# Patient Record
Sex: Female | Born: 1955 | Race: White | Hispanic: No | Marital: Married | Smoking: Never smoker
Health system: Southern US, Community
[De-identification: ages and names within clinical notes are randomized; demographics above are authoritative.]

## PROBLEM LIST (undated history)

## (undated) DIAGNOSIS — R519 Headache, unspecified: Secondary | ICD-10-CM

## (undated) DIAGNOSIS — R51 Headache: Secondary | ICD-10-CM

## (undated) DIAGNOSIS — F32A Depression, unspecified: Secondary | ICD-10-CM

## (undated) DIAGNOSIS — Z800 Family history of malignant neoplasm of digestive organs: Secondary | ICD-10-CM

## (undated) DIAGNOSIS — F329 Major depressive disorder, single episode, unspecified: Secondary | ICD-10-CM

## (undated) DIAGNOSIS — D649 Anemia, unspecified: Secondary | ICD-10-CM

## (undated) DIAGNOSIS — K219 Gastro-esophageal reflux disease without esophagitis: Secondary | ICD-10-CM

## (undated) DIAGNOSIS — F419 Anxiety disorder, unspecified: Secondary | ICD-10-CM

## (undated) DIAGNOSIS — I10 Essential (primary) hypertension: Secondary | ICD-10-CM

## (undated) DIAGNOSIS — Z8719 Personal history of other diseases of the digestive system: Secondary | ICD-10-CM

## (undated) DIAGNOSIS — M3500 Sicca syndrome, unspecified: Secondary | ICD-10-CM

## (undated) HISTORY — PX: OTHER SURGICAL HISTORY: SHX169

## (undated) HISTORY — DX: Sjogren syndrome, unspecified: M35.00

## (undated) HISTORY — PX: ABDOMINAL HYSTERECTOMY: SHX81

## (undated) HISTORY — DX: Family history of malignant neoplasm of digestive organs: Z80.0

---

## 1990-01-08 HISTORY — PX: BUNIONECTOMY: SHX129

## 1998-01-17 ENCOUNTER — Other Ambulatory Visit: Admission: RE | Admit: 1998-01-17 | Discharge: 1998-01-17 | Payer: Self-pay | Admitting: Obstetrics and Gynecology

## 2000-06-13 ENCOUNTER — Encounter: Payer: Self-pay | Admitting: Family Medicine

## 2000-06-13 ENCOUNTER — Ambulatory Visit (HOSPITAL_COMMUNITY): Admission: RE | Admit: 2000-06-13 | Discharge: 2000-06-13 | Payer: Self-pay | Admitting: Family Medicine

## 2000-12-23 ENCOUNTER — Ambulatory Visit (HOSPITAL_COMMUNITY): Admission: RE | Admit: 2000-12-23 | Discharge: 2000-12-23 | Payer: Self-pay | Admitting: Gastroenterology

## 2001-02-19 ENCOUNTER — Other Ambulatory Visit: Admission: RE | Admit: 2001-02-19 | Discharge: 2001-02-19 | Payer: Self-pay | Admitting: Gynecology

## 2002-03-16 ENCOUNTER — Other Ambulatory Visit: Admission: RE | Admit: 2002-03-16 | Discharge: 2002-03-16 | Payer: Self-pay | Admitting: Gynecology

## 2003-04-12 ENCOUNTER — Other Ambulatory Visit: Admission: RE | Admit: 2003-04-12 | Discharge: 2003-04-12 | Payer: Self-pay | Admitting: Gynecology

## 2004-04-24 ENCOUNTER — Other Ambulatory Visit: Admission: RE | Admit: 2004-04-24 | Discharge: 2004-04-24 | Payer: Self-pay | Admitting: Gynecology

## 2005-06-11 ENCOUNTER — Other Ambulatory Visit: Admission: RE | Admit: 2005-06-11 | Discharge: 2005-06-11 | Payer: Self-pay | Admitting: Gynecology

## 2006-06-24 ENCOUNTER — Other Ambulatory Visit: Admission: RE | Admit: 2006-06-24 | Discharge: 2006-06-24 | Payer: Self-pay | Admitting: Gynecology

## 2007-09-25 ENCOUNTER — Encounter: Admission: RE | Admit: 2007-09-25 | Discharge: 2007-09-25 | Payer: Self-pay | Admitting: Gynecology

## 2008-12-20 ENCOUNTER — Encounter: Admission: RE | Admit: 2008-12-20 | Discharge: 2008-12-20 | Payer: Self-pay | Admitting: Gynecology

## 2009-12-21 ENCOUNTER — Encounter
Admission: RE | Admit: 2009-12-21 | Discharge: 2009-12-21 | Payer: Self-pay | Source: Home / Self Care | Attending: Gynecology | Admitting: Gynecology

## 2010-05-26 NOTE — Procedures (Signed)
[place]. [facility]  Patient:    MIYO, AINA Visit Number: [id number] MRN: [id number]          Service Type: END Location: ENDO Attending Physician:  Rich Brave Dictated by:   Florencia Reasons, M.D. Proc. Date: 12/23/00 Admit Date:  12/23/2000   CC:         Esmeralda Arthur, M.D.   Procedure Report  PROCEDURE PERFORMED:  Colonoscopy.  ENDOSCOPIST:  Florencia Reasons, M.D.  INDICATIONS FOR PROCEDURE:  The patient is a 54 year old with a family history of colon cancer in her half sister at age 16, plus a history of polyps in her brother and another half sister.  The patient herself had a negative colonoscopy by me six years ago.  FINDINGS:  Normal exam to the terminal ileum.  DESCRIPTION OF PROCEDURE:  The nature, purpose and risks of the procedure were familiar to the patient from prior examination and she provided written consent.  Sedation was fentanyl 50 mcg and Versed 5 mg IV without arrhythmias or desaturation.  The Olympus adjustable tension pediatric video colonoscope was easily advanced quite easily to the cecum and for a short distance into a normal-appearing terminal ileum, whereupon pullback was initiated.  The quality of the prep was excellent and it is felt that all areas were well seen.  This was a normal examination.  No polyps, cancer, colitis, vascular malformations or diverticulosis were noted.  The patient tolerated the procedure well and there were no apparent complications.  No biopsies were obtained.  IMPRESSION:  Normal screening colonoscopy in a patient with a family history of colon cancer.  PLAN: Consider follow-up colonoscopy in five years for ongoing screening. Dictated by:   Florencia Reasons, M.D. Attending Physician:  Rich Brave DD:  12/23/00 TD:  12/23/00 Job: [phone]

## 2011-11-26 ENCOUNTER — Other Ambulatory Visit: Payer: Self-pay | Admitting: Gynecology

## 2011-11-26 DIAGNOSIS — Z1231 Encounter for screening mammogram for malignant neoplasm of breast: Secondary | ICD-10-CM

## 2011-12-27 ENCOUNTER — Ambulatory Visit: Payer: Self-pay

## 2012-09-02 ENCOUNTER — Encounter (INDEPENDENT_AMBULATORY_CARE_PROVIDER_SITE_OTHER): Payer: Self-pay | Admitting: *Deleted

## 2012-09-03 ENCOUNTER — Encounter (INDEPENDENT_AMBULATORY_CARE_PROVIDER_SITE_OTHER): Payer: Self-pay

## 2013-11-18 ENCOUNTER — Other Ambulatory Visit (INDEPENDENT_AMBULATORY_CARE_PROVIDER_SITE_OTHER): Payer: Self-pay | Admitting: *Deleted

## 2013-11-18 ENCOUNTER — Encounter (INDEPENDENT_AMBULATORY_CARE_PROVIDER_SITE_OTHER): Payer: Self-pay | Admitting: *Deleted

## 2013-11-18 DIAGNOSIS — Z800 Family history of malignant neoplasm of digestive organs: Secondary | ICD-10-CM

## 2013-11-18 NOTE — Telephone Encounter (Signed)
This encounter was created in error - please disregard.

## 2014-01-12 ENCOUNTER — Telehealth (INDEPENDENT_AMBULATORY_CARE_PROVIDER_SITE_OTHER): Payer: Self-pay | Admitting: *Deleted

## 2014-01-12 DIAGNOSIS — Z1211 Encounter for screening for malignant neoplasm of colon: Secondary | ICD-10-CM

## 2014-01-12 NOTE — Telephone Encounter (Signed)
Patient needs movi prep 

## 2014-01-15 MED ORDER — PEG-KCL-NACL-NASULF-NA ASC-C 100 G PO SOLR: 1.0000 | Freq: Once | ORAL | Status: DC

## 2014-01-19 ENCOUNTER — Telehealth (INDEPENDENT_AMBULATORY_CARE_PROVIDER_SITE_OTHER): Payer: Self-pay | Admitting: *Deleted

## 2014-01-19 NOTE — Telephone Encounter (Signed)
Referring MD/PCP: daniel   Procedure: tcs  Reason/Indication:  fam hx colon ca  Has patient had this procedure before?  Yes, 2009 -- scanned  If so, when, by whom and where?    Is there a family history of colon cancer?  Yes, sister  Who?  What age when diagnosed?    Is patient diabetic?   no      Does patient have prosthetic heart valve?  no  Do you have a pacemaker?  no  Has patient ever had endocarditis? no  Has patient had joint replacement within last 12 months?  no  Does patient tend to be constipated or take laxatives? no  Is patient on Coumadin, Plavix and/or Aspirin? no  Medications: welbutrin 150 mg daily, evoxac 30 mg up to 4 tabs daily, zoloft 100 mg bid, cozaar 100 mg daily, dexilant 60 mg daily, restasis eye drops bid, elestrin 2 pumps daily  Allergies: sulfur, ace inhibitors  Medication Adjustment:   Procedure date & time: 02/17/14 at 830

## 2014-01-22 NOTE — Telephone Encounter (Signed)
agree

## 2014-02-02 ENCOUNTER — Encounter (INDEPENDENT_AMBULATORY_CARE_PROVIDER_SITE_OTHER): Payer: Self-pay | Admitting: *Deleted

## 2014-02-17 ENCOUNTER — Encounter (HOSPITAL_COMMUNITY): Admission: RE | Payer: Self-pay | Source: Ambulatory Visit

## 2014-02-17 ENCOUNTER — Ambulatory Visit (HOSPITAL_COMMUNITY)
Admission: RE | Admit: 2014-02-17 | Payer: PRIVATE HEALTH INSURANCE | Source: Ambulatory Visit | Admitting: Internal Medicine

## 2014-02-17 SURGERY — COLONOSCOPY
Anesthesia: Moderate Sedation

## 2015-09-23 ENCOUNTER — Encounter (INDEPENDENT_AMBULATORY_CARE_PROVIDER_SITE_OTHER): Payer: Self-pay | Admitting: *Deleted

## 2016-08-10 ENCOUNTER — Emergency Department (HOSPITAL_COMMUNITY)
Admission: EM | Admit: 2016-08-10 | Discharge: 2016-08-10 | Disposition: A | Discharge status: Home / Self Care | Payer: 59 | Attending: Emergency Medicine | Admitting: Emergency Medicine

## 2016-08-10 ENCOUNTER — Emergency Department (HOSPITAL_COMMUNITY): Payer: 59

## 2016-08-10 ENCOUNTER — Encounter (HOSPITAL_COMMUNITY): Payer: Self-pay | Admitting: Emergency Medicine

## 2016-08-10 DIAGNOSIS — S63502A Unspecified sprain of left wrist, initial encounter: Secondary | ICD-10-CM

## 2016-08-10 DIAGNOSIS — Y929 Unspecified place or not applicable: Secondary | ICD-10-CM | POA: Diagnosis not present

## 2016-08-10 DIAGNOSIS — W010XXA Fall on same level from slipping, tripping and stumbling without subsequent striking against object, initial encounter: Secondary | ICD-10-CM | POA: Diagnosis not present

## 2016-08-10 DIAGNOSIS — Z79899 Other long term (current) drug therapy: Secondary | ICD-10-CM | POA: Diagnosis not present

## 2016-08-10 DIAGNOSIS — T148XXA Other injury of unspecified body region, initial encounter: Secondary | ICD-10-CM

## 2016-08-10 DIAGNOSIS — Y999 Unspecified external cause status: Secondary | ICD-10-CM | POA: Insufficient documentation

## 2016-08-10 DIAGNOSIS — W19XXXA Unspecified fall, initial encounter: Secondary | ICD-10-CM

## 2016-08-10 DIAGNOSIS — Y9301 Activity, walking, marching and hiking: Secondary | ICD-10-CM | POA: Diagnosis not present

## 2016-08-10 DIAGNOSIS — S53402A Unspecified sprain of left elbow, initial encounter: Secondary | ICD-10-CM

## 2016-08-10 DIAGNOSIS — S59902A Unspecified injury of left elbow, initial encounter: Secondary | ICD-10-CM | POA: Diagnosis present

## 2016-08-10 IMAGING — DX DG ELBOW COMPLETE 3+V*L*
4 series · 4 of 4 positions shown · non-contrast
Comparison: None.

CLINICAL DATA: Fall, left elbow pain.

EXAM:
LEFT ELBOW - COMPLETE 3+ VIEW

[elbow ap]
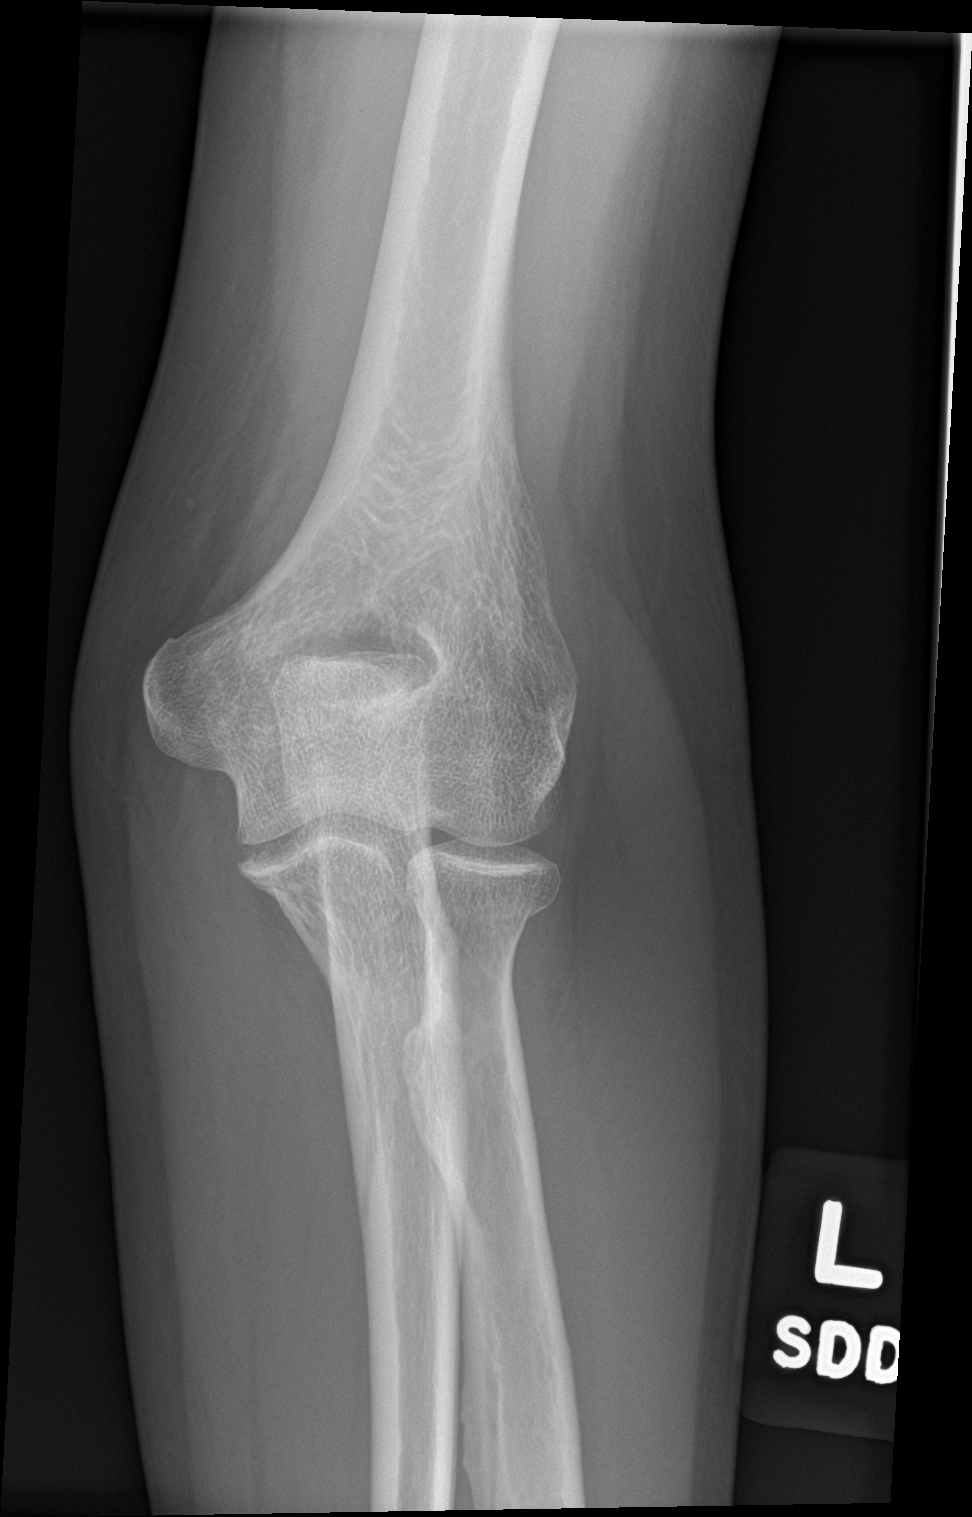

[elbow obl (1 of 2)]
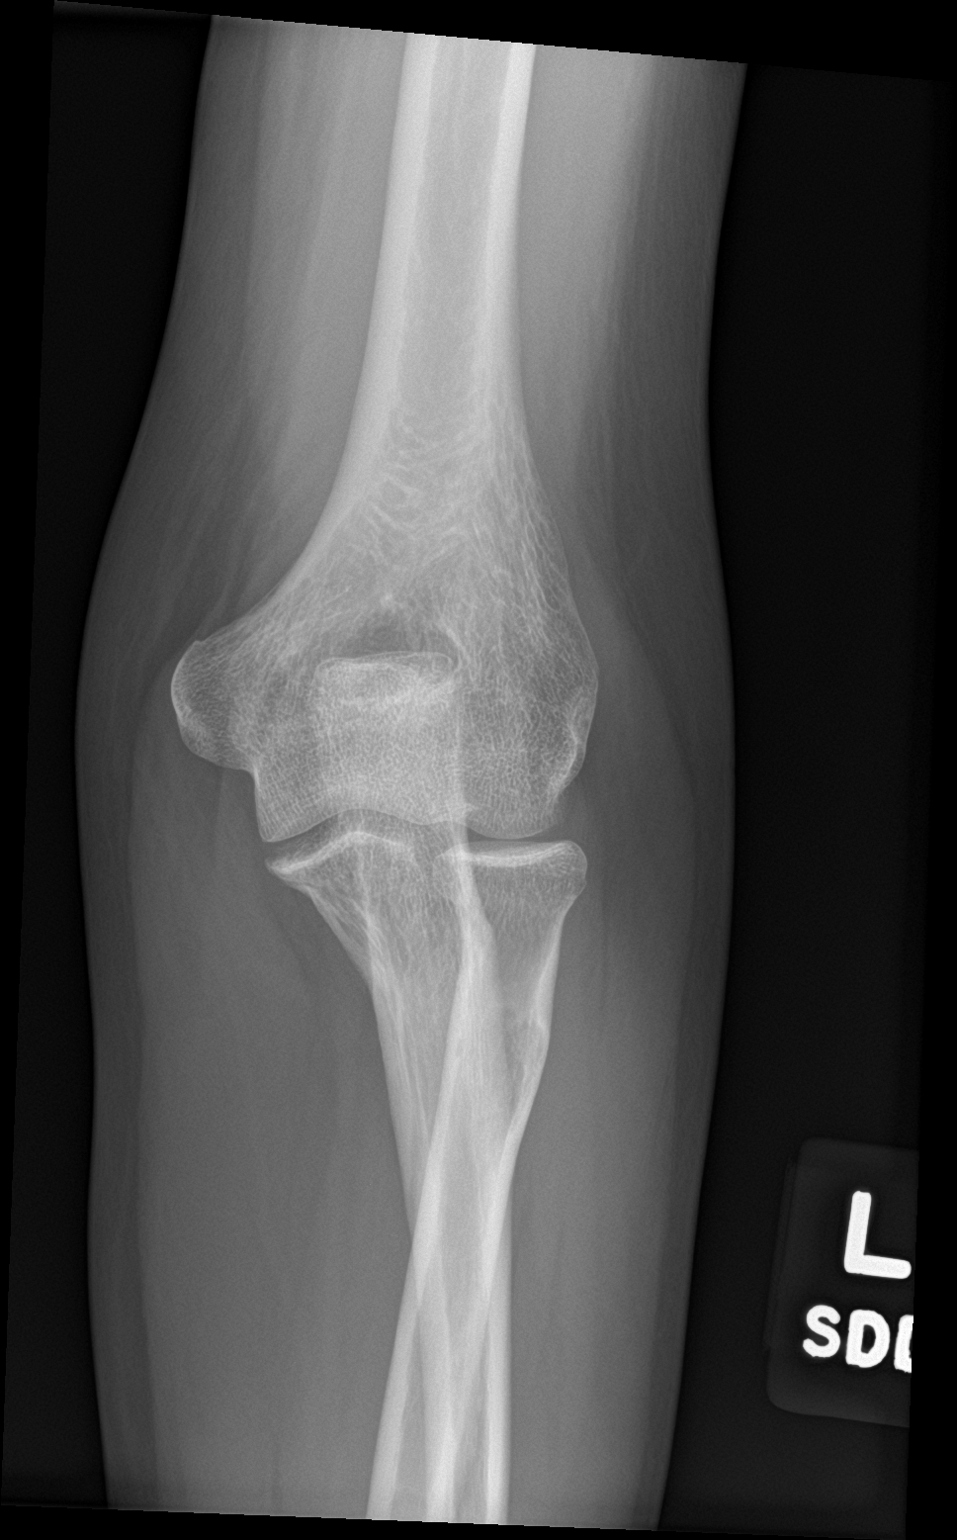

[elbow lat]
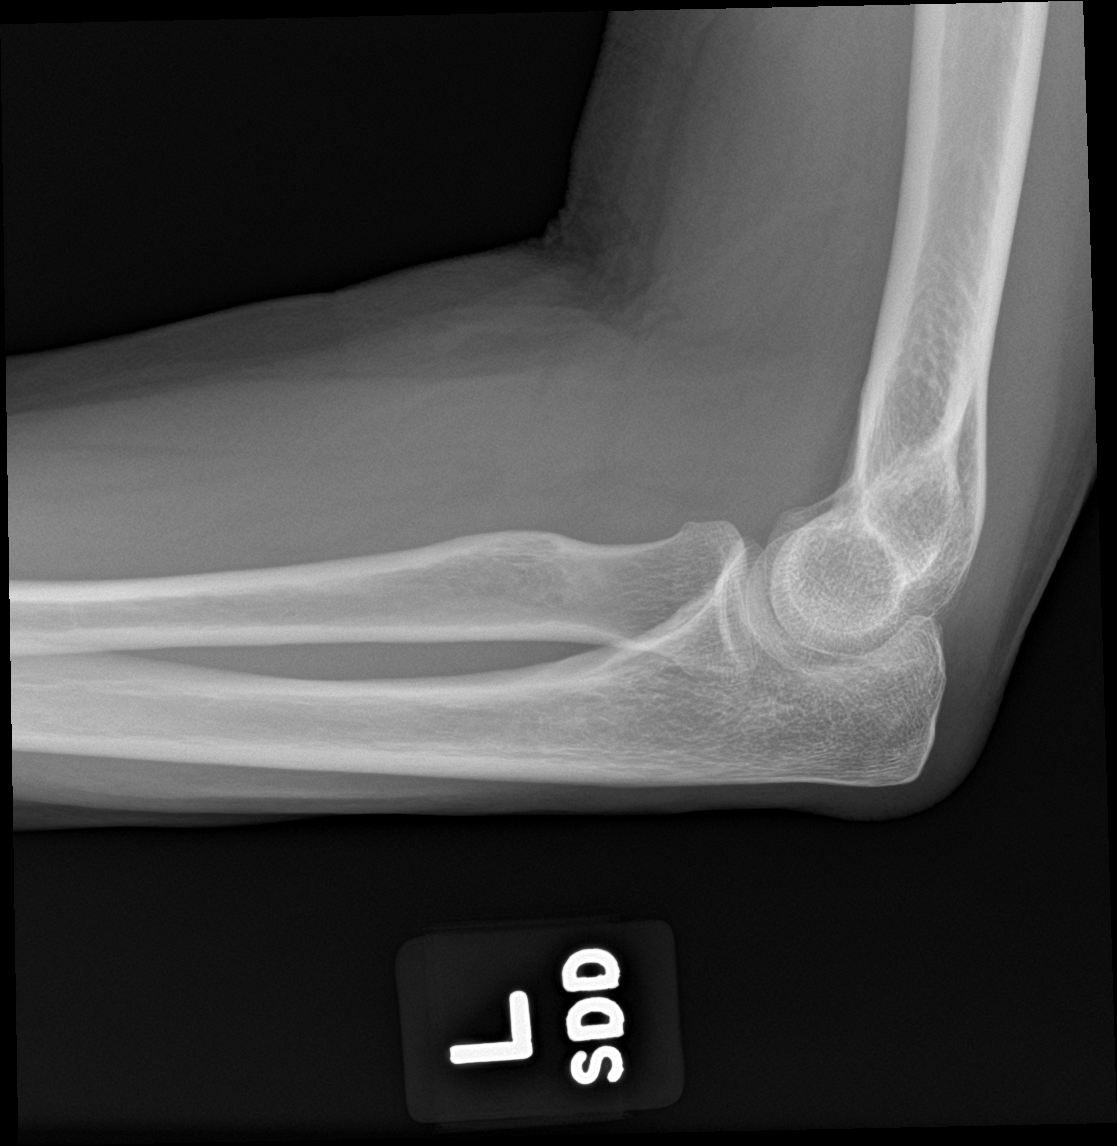

[elbow obl (2 of 2)]
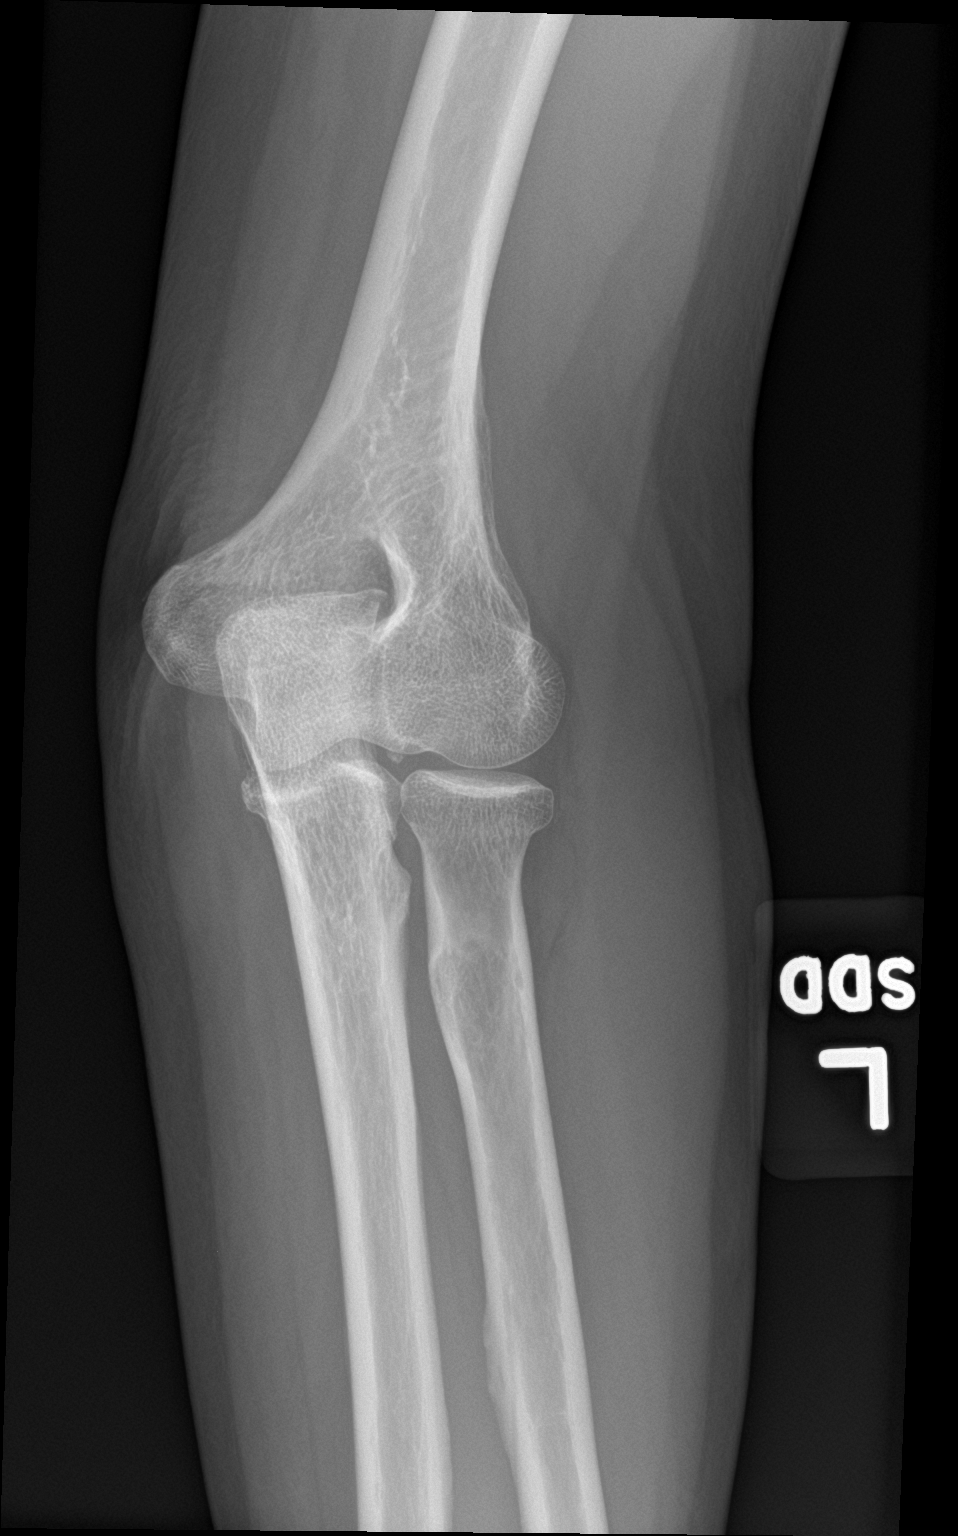

[4 of 4 positions shown; findings below may reference images not displayed]

FINDINGS: There is no evidence of fracture, dislocation, or joint effusion.
There is no evidence of arthropathy or other focal bone abnormality.
Soft tissues are unremarkable.
IMPRESSION: Negative.

## 2016-08-10 IMAGING — DX DG WRIST COMPLETE 3+V*L*
4 series · 4 of 4 positions shown · non-contrast
Comparison: None.

CLINICAL DATA: Fall, left elbow and wrist pain.

EXAM:
LEFT WRIST - COMPLETE 3+ VIEW

[wrist pa]
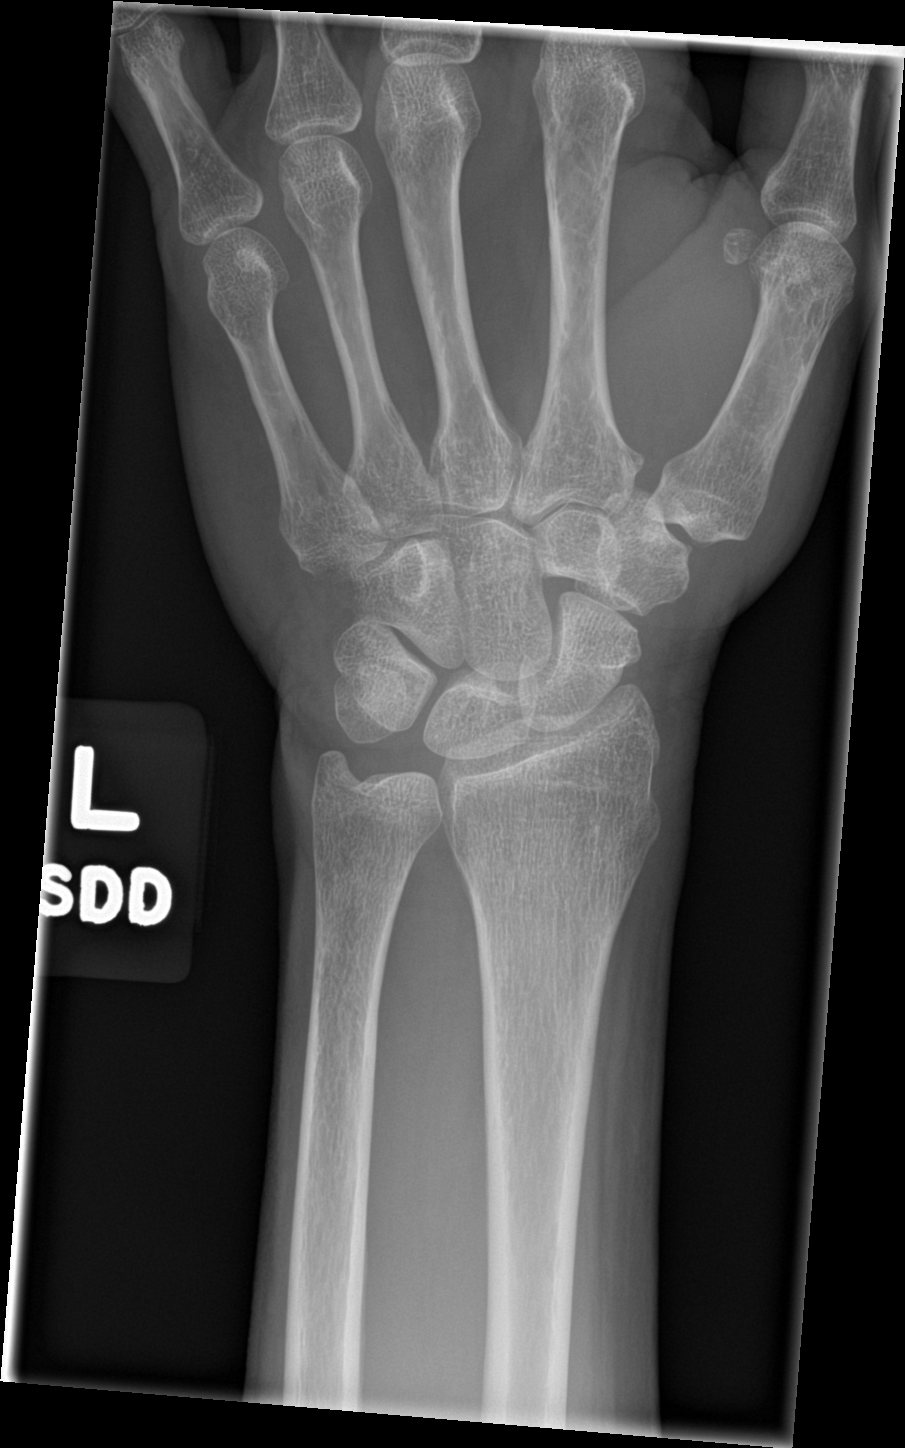

[wrist obl]
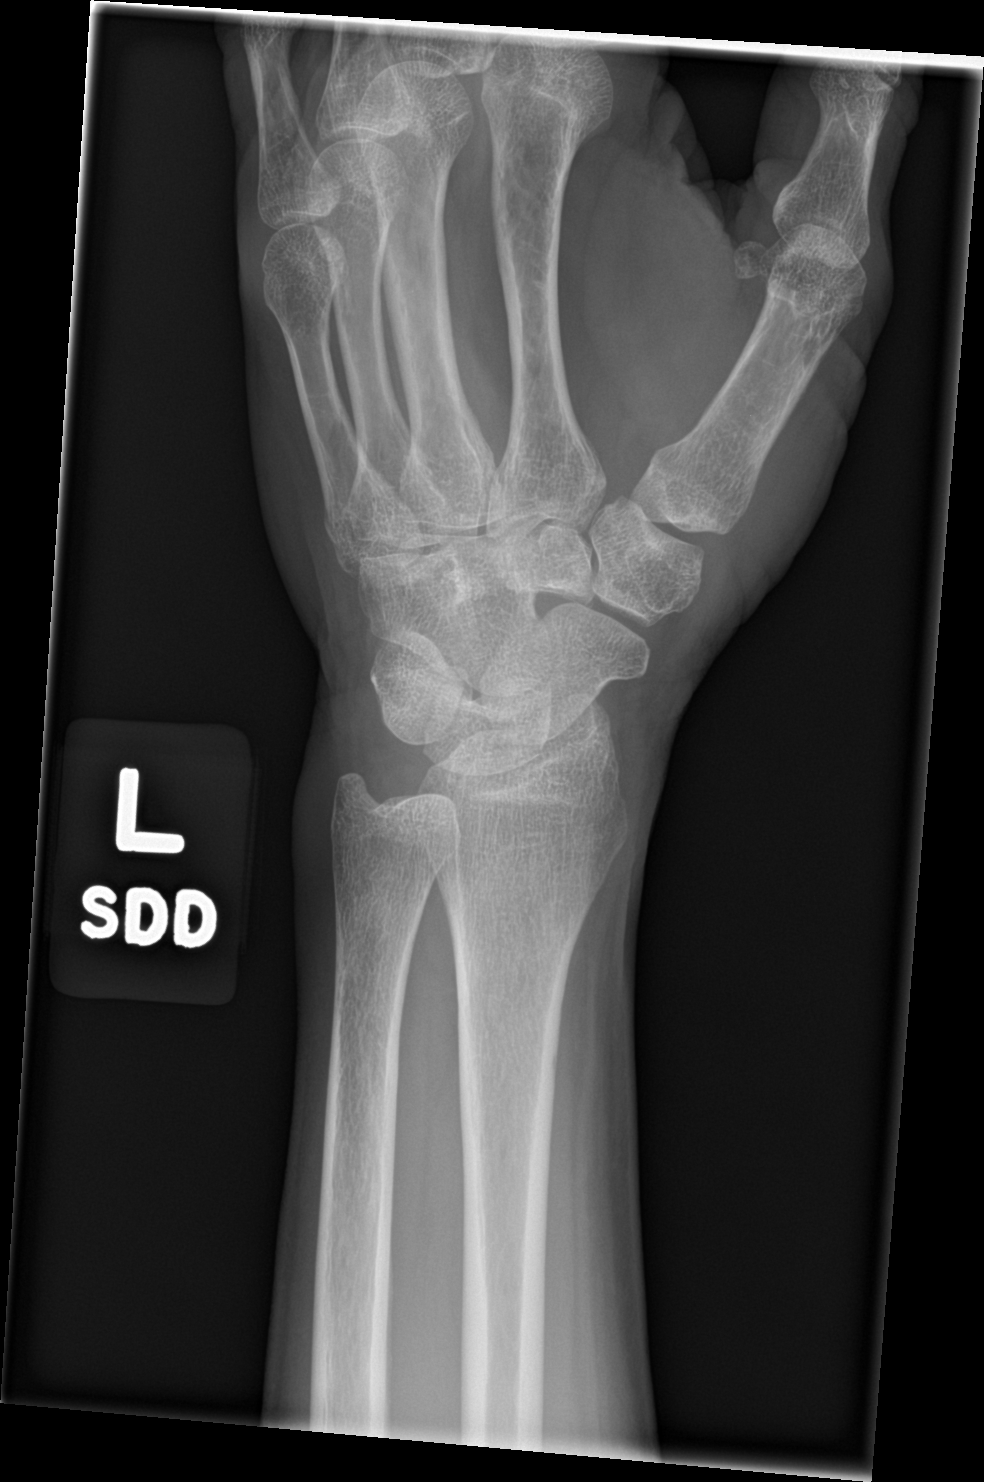

[wrist lat]
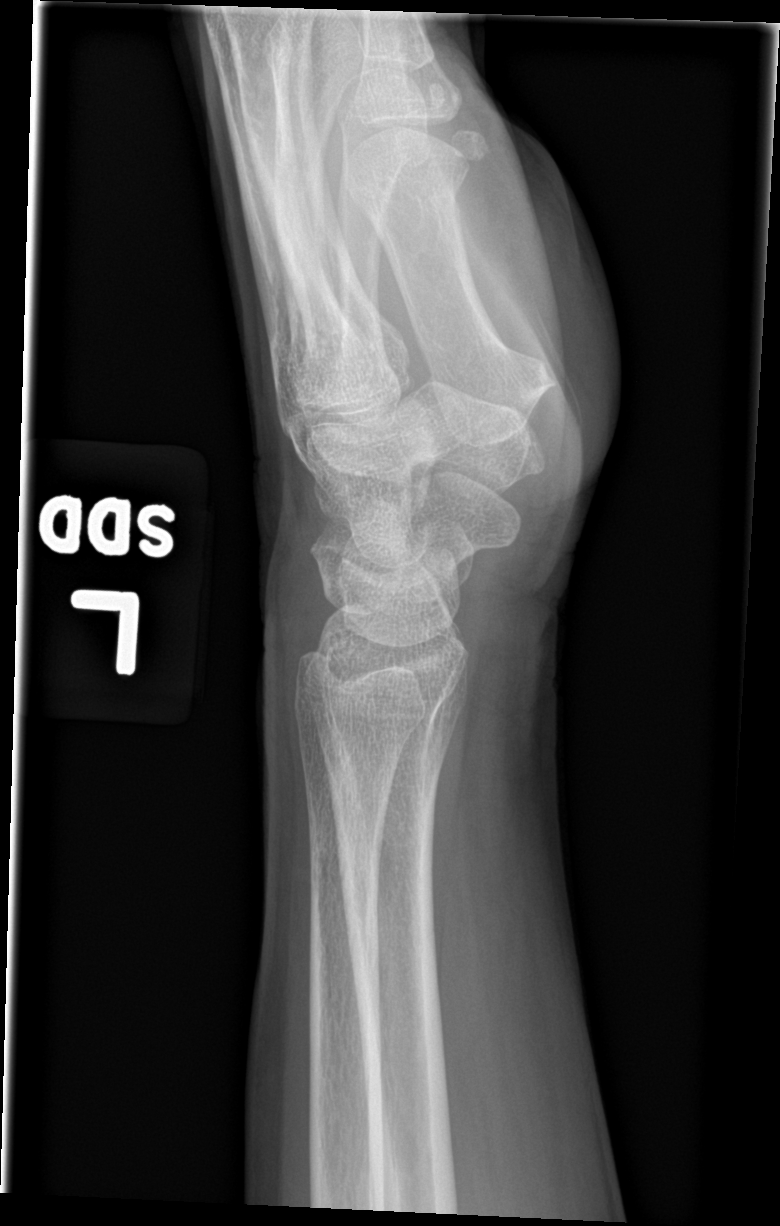

[wrist navicular]
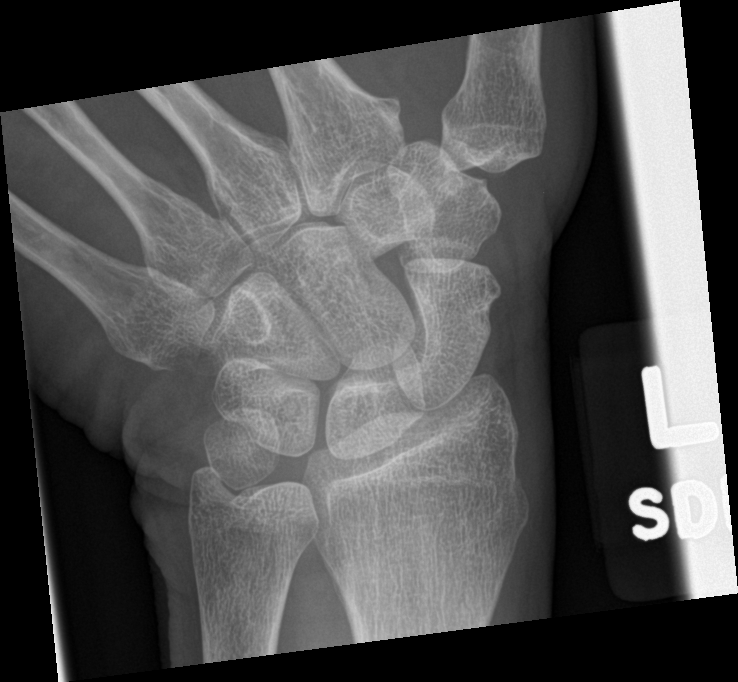

[4 of 4 positions shown; findings below may reference images not displayed]

FINDINGS: There is no evidence of fracture or dislocation. There is no
evidence of arthropathy or other focal bone abnormality. Soft
tissues are unremarkable.
IMPRESSION: Negative.

## 2016-08-10 MED ORDER — TRAMADOL HCL 50 MG PO TABS: 50.0000 mg | ORAL_TABLET | Freq: Four times a day (QID) | ORAL | 0 refills | Status: DC | PRN

## 2016-08-10 NOTE — ED Notes (Signed)
Pt alert & oriented x4, stable gait. Patient given discharge instructions, paperwork & prescription(s). Patient  instructed to stop at the registration desk to finish any additional paperwork. Patient verbalized understanding. Pt left department w/ no further questions. 

## 2016-08-10 NOTE — ED Triage Notes (Signed)
Fell on driveway- now with bilateral hand pain and L elbow pain  Dr Reuel Boomaniel PCP

## 2016-08-10 NOTE — ED Provider Notes (Signed)
[place] DEPT Provider Note   CSN: [id number] Arrival date & time: 08/10/16  1909     History   Chief Complaint Chief Complaint  Patient presents with  . Fall    HPI Jeanette Suarez is a 61 y.o. female.  HPI   Jeanette Suarez is a 61 y.o. female who presents to the Emergency Department complaining of pain the her left wrist and elbow secondary to a mechanical fall shortly before ER arrival.  States that she was walking her dog when she tripped on the asphalt, falling on her hands and left arm.  She describes pain with movement of the wrist and elbow and abrasions to her left knee and palms.  She denies head injury, neck or back pain, numbness or swelling.  TD is up to date.  She has cleaned the wounds prior to arrival    History reviewed. No pertinent past medical history.  There are no active problems to display for this patient.   Past Surgical History:  Procedure Laterality Date  . ABDOMINAL HYSTERECTOMY      OB History    No data available       Home Medications    Prior to Admission medications   Medication Sig Start Date End Date Taking? Authorizing Provider  acetaminophen (TYLENOL) 500 MG tablet Take 1,000 mg by mouth every 6 (six) hours as needed for mild pain or headache.    [provider]  buPROPion (WELLBUTRIN XL) 150 MG 24 hr tablet Take 150 mg by mouth daily.    [provider]  cevimeline (EVOXAC) 30 MG capsule Take 30 mg by mouth 4 (four) times daily.    [provider]  cycloSPORINE (RESTASIS) 0.05 % ophthalmic emulsion Place 1 drop into both eyes 2 (two) times daily.    [provider]  dexlansoprazole (DEXILANT) 60 MG capsule Take 60 mg by mouth daily.    [provider]  losartan (COZAAR) 100 MG tablet Take 100 mg by mouth daily.    [provider]  peg 3350 powder (MOVIPREP) 100 G SOLR Take 1 kit (200 g total) by mouth once. 01/15/14   Setzer, Rona Ravens, NP  sertraline (ZOLOFT) 100 MG tablet  Take 100 mg by mouth daily.    [provider]    Family History No family history on file.  Social History Social History  Substance Use Topics  . Smoking status: Never Smoker  . Smokeless tobacco: Never Used  . Alcohol use No     Allergies   Ace inhibitors and Sulfa antibiotics   Review of Systems Review of Systems  Constitutional: Negative for chills and fever.  Respiratory: Negative for shortness of breath.   Cardiovascular: Negative for chest pain.  Gastrointestinal: Negative for abdominal pain, nausea and vomiting.  Genitourinary: Negative for difficulty urinating and dysuria.  Musculoskeletal: Positive for arthralgias (left wrist and elbow pain). Negative for back pain, joint swelling and neck pain.  Skin: Negative for color change and wound.       abrasions  Neurological: Negative for dizziness, weakness, numbness and headaches.  Psychiatric/Behavioral: Negative for confusion.  All other systems reviewed and are negative.    Physical Exam Updated Vital Signs BP 139/79   Pulse 91   Temp 97.8 F (36.6 C)   Resp 18   Ht 5' 4.5" (1.638 m)   Wt 70.3 kg (155 lb)   SpO2 96%   BMI 26.19 kg/m   Physical Exam  Constitutional: She is oriented  to person, place, and time. She appears well-developed and well-nourished. No distress.  HENT:  Head: Normocephalic and atraumatic.  Mouth/Throat: Oropharynx is clear and moist.  Neck: Normal range of motion and phonation normal. Neck supple. No spinous process tenderness and no muscular tenderness present.  Cardiovascular: Normal rate, regular rhythm and intact distal pulses.   Pulmonary/Chest: Effort normal and breath sounds normal. She exhibits no tenderness.  Musculoskeletal: She exhibits tenderness. She exhibits no edema.  ttp of the lateral left elbow.  No edema or bony deformity. Pain reproduced on supination. Tenderness of the distal left wrist w/o bony deformity.  Pt has full ROM of the elbow and wrist.     Neurological: She is alert and oriented to person, place, and time. She exhibits normal muscle tone. Coordination normal.  Skin: Skin is warm and dry. Capillary refill takes less than 2 seconds.  abrasions to volar surface of bilateral hands, left knee  Psychiatric: She has a normal mood and affect.  Nursing note and vitals reviewed.    ED Treatments / Results  Labs (all labs ordered are listed, but only abnormal results are displayed) Labs Reviewed - No data to display  EKG  EKG Interpretation None       Radiology Dg Elbow Complete Left  Result Date: 08/10/2016 CLINICAL DATA:  Fall, left elbow pain. EXAM: LEFT ELBOW - COMPLETE 3+ VIEW COMPARISON:  None. FINDINGS: There is no evidence of fracture, dislocation, or joint effusion. There is no evidence of arthropathy or other focal bone abnormality. Soft tissues are unremarkable. IMPRESSION: Negative. Electronically Signed   By: Franki Cabot M.D.   On: 08/10/2016 20:06   Dg Wrist Complete Left  Result Date: 08/10/2016 CLINICAL DATA:  Fall, left elbow and wrist pain. EXAM: LEFT WRIST - COMPLETE 3+ VIEW COMPARISON:  None. FINDINGS: There is no evidence of fracture or dislocation. There is no evidence of arthropathy or other focal bone abnormality. Soft tissues are unremarkable. IMPRESSION: Negative. Electronically Signed   By: Franki Cabot M.D.   On: 08/10/2016 20:05    Procedures Procedures (including critical care time)  Medications Ordered in ED Medications - No data to display   Initial Impression / Assessment and Plan / ED Course  I have reviewed the triage vital signs and the nursing notes.  Pertinent labs & imaging results that were available during my care of the patient were reviewed by me and considered in my medical decision making (see chart for details).     Td up to date.  Wounds cleaned and bandaged.    XR's neg for fracture.  NV intact.  Sling applied.  Pt agrees to ice, Ibuprofen and orthopedic referral if  not improving.    Final Clinical Impressions(s) / ED Diagnoses   Final diagnoses:  Fall, initial encounter  Abrasion  Sprain of left elbow, initial encounter  Sprain of left wrist, initial encounter    New Prescriptions New Prescriptions   No medications on file     Kem Parkinson, Hershal Coria 08/10/16 2035    Fredia Sorrow, MD 08/11/16 [phone]

## 2016-08-10 NOTE — Discharge Instructions (Signed)
Apply ice packs on/off to your wrist and elbow.  Ibuprofen every 6-8 hrs for pain.  Call the orthopedic provider listed to arrange a follow-up appt in one week if not improving

## 2016-08-10 NOTE — ED Notes (Signed)
Abrasions cleaned to both hand a the left knee. Band-aid applied to all.

## 2016-08-10 NOTE — ED Triage Notes (Signed)
Pt tripped on driveway and has abrasions to bilateral palms and left knee. Pt c/o left arm pain.

## 2016-09-03 ENCOUNTER — Ambulatory Visit (INDEPENDENT_AMBULATORY_CARE_PROVIDER_SITE_OTHER): Payer: PRIVATE HEALTH INSURANCE

## 2016-09-03 ENCOUNTER — Encounter: Payer: Self-pay | Admitting: Orthopedic Surgery

## 2016-09-03 ENCOUNTER — Other Ambulatory Visit: Payer: Self-pay | Admitting: *Deleted

## 2016-09-03 ENCOUNTER — Ambulatory Visit (INDEPENDENT_AMBULATORY_CARE_PROVIDER_SITE_OTHER): Payer: 59 | Admitting: Orthopedic Surgery

## 2016-09-03 VITALS — BP 131/78 | HR 104 | Ht 64.5 in | Wt 152.0 lb

## 2016-09-03 DIAGNOSIS — S60212A Contusion of left wrist, initial encounter: Secondary | ICD-10-CM

## 2016-09-03 DIAGNOSIS — S5002XA Contusion of left elbow, initial encounter: Secondary | ICD-10-CM

## 2016-09-03 DIAGNOSIS — M25532 Pain in left wrist: Secondary | ICD-10-CM | POA: Diagnosis not present

## 2016-09-03 NOTE — Progress Notes (Signed)
NEW PATIENT OFFICE VISIT    Chief Complaint  Patient presents with  . Arm Injury    left wrist, left elbow pain s/p fall 08/10/16    Jeanette Suarez is a 61 y.o. female who presented to the Emergency Department on August 3 complaining of pain the her left wrist and elbow secondary to a mechanical fall shortly before ER arrival; she has confirmed that she was walking her dog when she tripped on the asphalt, falling on her hands and left arm.  She had  pain with movement of the wrist and elbow and abrasions to her left knee and palms.  She denied head injury, neck or back pain, numbness or swelling.  TD is up to date.  She has cleaned the wounds prior to arrival.  X-rays of the elbow and wrist were read as negative is being confirmed with my independent review.  She now c/o persistent left wrist pain. She says she is not any better she's actually worse. She's having difficulty with some of her activities of daily living such as wringing out a cloth and reaching.    Review of Systems  Musculoskeletal: Positive for myalgias.  Neurological: Negative for tingling and sensory change.   No past medical history on file.  Past Surgical History:  Procedure Laterality Date  . ABDOMINAL HYSTERECTOMY      No family history on file. Social History  Substance Use Topics  . Smoking status: Never Smoker  . Smokeless tobacco: Never Used  . Alcohol use No    BP 131/78   Pulse (!) 104   Ht 5' 4.5" (1.638 m)   Wt 152 lb (68.9 kg)   BMI 25.69 kg/m   Physical Exam  Constitutional: She is oriented to person, place, and time. She appears well-developed and well-nourished. No distress.  Cardiovascular: Normal rate and intact distal pulses.   Neurological: She is alert and oriented to person, place, and time. She has normal reflexes. She exhibits normal muscle tone. Coordination normal.  Skin: Skin is warm and dry. No rash noted. She is not diaphoretic. No erythema. No pallor.  Psychiatric: She  has a normal mood and affect. Her behavior is normal. Judgment and thought content normal.    Ortho Exam Left elbow Inspection reveals normal alignment no deformity no gross tenderness full range of motion no instability normal muscle tone skin is intact  Left hand and wrist first extensor compartment nontender Scaphoid is a little tender Watson test is normal motor exam is normal skin is intact pulses are good in the hand sensation is normal  Initial x-rays of the wrist and elbow were read as normal see the report in the record which I've already reviewed and I interpret this as a normal elbow x-ray and wrist x-ray normal.  Repeat x-ray of the scaphoid: I reviewed and read the x-ray. There is no fracture dislocation or abnormality wrist joint including the scaphoid   Meds ordered this encounter  Medications  . ALPRAZOLAM PO    Sig: Take by mouth as needed.  . Estradiol (ELESTRIN TD)    Sig: Place onto the skin.    Encounter Diagnoses  Name Primary?  . Left wrist pain Yes  . Contusion of left wrist, initial encounter   . Contusion of left elbow, initial encounter      PLAN:   Symptomatic treatment. Ibuprofen 3 times a day If any swelling apply ice 20 minutes as needed Repeat examination in 4 weeks Supportive splint left wrist  No work today.

## 2016-09-03 NOTE — Patient Instructions (Addendum)
OOW TODAY  Ibuprofen 3 times a day If any swelling apply ice 20 minutes as needed Repeat examination in 4 weeks

## 2016-09-05 ENCOUNTER — Ambulatory Visit: Payer: PRIVATE HEALTH INSURANCE | Admitting: Orthopedic Surgery

## 2016-10-01 ENCOUNTER — Ambulatory Visit (INDEPENDENT_AMBULATORY_CARE_PROVIDER_SITE_OTHER): Payer: PRIVATE HEALTH INSURANCE | Admitting: Orthopedic Surgery

## 2016-10-01 ENCOUNTER — Encounter: Payer: Self-pay | Admitting: Orthopedic Surgery

## 2016-10-01 VITALS — BP 134/82 | HR 97 | Ht 64.0 in | Wt 153.0 lb

## 2016-10-01 DIAGNOSIS — S5002XA Contusion of left elbow, initial encounter: Secondary | ICD-10-CM | POA: Diagnosis not present

## 2016-10-01 DIAGNOSIS — S60212A Contusion of left wrist, initial encounter: Secondary | ICD-10-CM | POA: Diagnosis not present

## 2016-10-01 DIAGNOSIS — M7712 Lateral epicondylitis, left elbow: Secondary | ICD-10-CM | POA: Diagnosis not present

## 2016-10-01 DIAGNOSIS — M25532 Pain in left wrist: Secondary | ICD-10-CM

## 2016-10-01 NOTE — Patient Instructions (Signed)
Wear brace for 6 weeks, you can remove it for bathing sleeping. You can wear at work.  Start exercises do for 6 weeks  Six-week follow-up

## 2016-10-01 NOTE — Progress Notes (Signed)
Follow-up visit  Chief Complaint  Patient presents with  . Follow-up    Recheck on left elbow and wrist, DOI 08-10-16.    History 61 year old female had a fall contusion left wrist and elbow  We treated her with splinting and conservative measures.  Comes in today complaining of continued wrist and elbow pain with painful wrist extension activities with elbow pain radiating down the wrist as well.  Review of systems she says she has no numbness or tingling  Today's examination shows tenderness over the elbow over the lateral epicondyle without swelling she has painful elbow extension but can reach full extension full flexion pronation supination elbow stable skin is intact pulses are good sensation is normal she has mild wrist pain as well  Impression Encounter Diagnoses  Name Primary?  . Left wrist pain   . Contusion of left wrist, initial encounter   . Contusion of left elbow, initial encounter   . Lateral epicondylitis of left elbow Yes    Recommend tennis elbow brace, super 7 exercises. Follow-up in 6 weeks

## 2016-10-24 ENCOUNTER — Telehealth: Payer: Self-pay

## 2016-10-24 NOTE — Telephone Encounter (Signed)
Pt called to schedule her colonoscopy. No GI issues, no blood thinners or hx of heart attacks. Please call [phone] option 0 or [phone]

## 2016-10-25 NOTE — Telephone Encounter (Signed)
LMOM to call.

## 2016-11-12 ENCOUNTER — Encounter: Payer: Self-pay | Admitting: Orthopedic Surgery

## 2016-11-12 ENCOUNTER — Ambulatory Visit: Payer: PRIVATE HEALTH INSURANCE | Admitting: Orthopedic Surgery

## 2016-11-14 NOTE — Telephone Encounter (Signed)
Tried to call pt at both numbers. The [phone] is NOT her number.  I am mailing a letter for pt to call.

## 2017-02-01 ENCOUNTER — Encounter (INDEPENDENT_AMBULATORY_CARE_PROVIDER_SITE_OTHER): Payer: Self-pay | Admitting: *Deleted

## 2017-05-21 ENCOUNTER — Encounter: Payer: Self-pay | Admitting: Internal Medicine

## 2017-05-26 ENCOUNTER — Encounter: Payer: Self-pay | Admitting: Internal Medicine

## 2017-06-06 ENCOUNTER — Ambulatory Visit (INDEPENDENT_AMBULATORY_CARE_PROVIDER_SITE_OTHER): Payer: Self-pay | Admitting: Internal Medicine

## 2017-06-17 ENCOUNTER — Encounter (INDEPENDENT_AMBULATORY_CARE_PROVIDER_SITE_OTHER): Payer: Self-pay | Admitting: *Deleted

## 2017-06-17 ENCOUNTER — Telehealth (INDEPENDENT_AMBULATORY_CARE_PROVIDER_SITE_OTHER): Payer: Self-pay | Admitting: *Deleted

## 2017-06-17 ENCOUNTER — Encounter (INDEPENDENT_AMBULATORY_CARE_PROVIDER_SITE_OTHER): Payer: Self-pay | Admitting: Internal Medicine

## 2017-06-17 ENCOUNTER — Ambulatory Visit (INDEPENDENT_AMBULATORY_CARE_PROVIDER_SITE_OTHER): Payer: 59 | Admitting: Internal Medicine

## 2017-06-17 VITALS — BP 132/80 | HR 84 | Temp 98.1°F | Ht 64.5 in | Wt 146.7 lb

## 2017-06-17 DIAGNOSIS — M3500 Sicca syndrome, unspecified: Secondary | ICD-10-CM | POA: Insufficient documentation

## 2017-06-17 DIAGNOSIS — K921 Melena: Secondary | ICD-10-CM

## 2017-06-17 DIAGNOSIS — Z800 Family history of malignant neoplasm of digestive organs: Secondary | ICD-10-CM

## 2017-06-17 MED ORDER — PEG 3350-KCL-NA BICARB-NACL 420 G PO SOLR: 4000.0000 mL | Freq: Once | ORAL | 0 refills | Status: AC

## 2017-06-17 NOTE — Patient Instructions (Signed)
The risks of bleeding, perforation and infection were reviewed with patient.  

## 2017-06-17 NOTE — Telephone Encounter (Signed)
Patient needs trilyte 

## 2017-06-17 NOTE — Progress Notes (Signed)
   Subjective:    Patient ID: Jeanette Suarez, female    DOB: 02/01/1955, 62 y.o.   MRN: [id number]  HPI Referred by Dr. Reuel Boomaniel for melena. She tells me 3-4 weeks ago she was sick. She had nausea and vomiting, diarrhea, and she thinks she had a fever.  Her stools were very dark and she says some were black. Melena x 3 days. Took one dose of Pepto Bismol while she was sick.  She was seen in the ED and then discharged. She was told she had a hiatal hernia. She was told she possibly had a virus and was given fluids. She is 100% better now.   Family hx of colon cancer. Family of colon cancer at age 60 and she is doing well now.  Stools are basically normal.  Certain foods bother her (Ice cream and spicy foods). Appetite is good. No weight loss. Having BM x 2 a day. No melena now.  Takes Dexilant in am for GERD and Zantac at hs. Her last colonoscopy was in 2009 (high risk screening). Family hx colon cancer in a sister with surgery at age 60.  Small external hemorrhoids, otherwise normal colonoscopy.   Hx of Sjogren's disease.   Does not takes Xanax often   04/22/2017 H and H  11/7 and 35.9  Review of Systems Past Medical History:  Diagnosis Date  . Family hx of colon cancer   . Sjogren's disease [facility](HCC)     Past Surgical History:  Procedure Laterality Date  . ABDOMINAL HYSTERECTOMY      Allergies  Allergen Reactions  . Ace Inhibitors Cough  . Sulfa Antibiotics Swelling, Rash and Cough    Swelling to hands and lips.    Current Outpatient Medications on File Prior to Visit  Medication Sig Dispense Refill  . ALPRAZOLAM PO Take by mouth as needed.    Marland Kitchen. buPROPion (WELLBUTRIN XL) 150 MG 24 hr tablet Take 150 mg by mouth daily.    . cevimeline (EVOXAC) 30 MG capsule Take 30 mg by mouth 4 (four) times daily.    Marland Kitchen. dexlansoprazole (DEXILANT) 60 MG capsule Take 60 mg by mouth daily.    . Estradiol (ELESTRIN TD) Place onto the skin.    Marland Kitchen. sertraline (ZOLOFT) 100 MG tablet Take 100 mg by mouth  daily.    . traMADol (ULTRAM) 50 MG tablet Take 1 tablet (50 mg total) by mouth every 6 (six) hours as needed. 15 tablet 0   No current facility-administered medications on file prior to visit.         Objective:   Physical Exam Blood pressure 132/80, pulse 84, temperature 98.1 F (36.7 C), height 5' 4.5" (1.638 m), weight 146 lb 11.2 oz (66.5 kg). Alert and oriented. Skin warm and dry. Oral mucosa is moist.   . Sclera anicteric, conjunctivae is pink. Thyroid not enlarged. No cervical lymphadenopathy. Lungs clear. Heart regular rate and rhythm.  Abdomen is soft. Bowel sounds are positive. No hepatomegaly. No abdominal masses felt. No tenderness.  No edema to lower extremities.           Assessment & Plan:  Hx of melena. Resolved. GI bleed needs to be ruled out. EGD Family hx of colon cancer: Needs surveillance.  colonoscopy [facility]

## 2017-07-22 ENCOUNTER — Ambulatory Visit (HOSPITAL_COMMUNITY)
Admission: RE | Admit: 2017-07-22 | Discharge: 2017-07-22 | Disposition: A | Discharge status: Home / Self Care | Payer: 59 | Source: Ambulatory Visit | Attending: Internal Medicine | Admitting: Internal Medicine

## 2017-07-22 ENCOUNTER — Encounter (HOSPITAL_COMMUNITY): Payer: Self-pay | Admitting: *Deleted

## 2017-07-22 ENCOUNTER — Other Ambulatory Visit: Payer: Self-pay

## 2017-07-22 ENCOUNTER — Encounter (HOSPITAL_COMMUNITY)
Admission: RE | Disposition: A | Discharge status: Home / Self Care | Payer: Self-pay | Source: Ambulatory Visit | Attending: Internal Medicine

## 2017-07-22 DIAGNOSIS — Z79899 Other long term (current) drug therapy: Secondary | ICD-10-CM | POA: Insufficient documentation

## 2017-07-22 DIAGNOSIS — Z882 Allergy status to sulfonamides status: Secondary | ICD-10-CM | POA: Insufficient documentation

## 2017-07-22 DIAGNOSIS — F419 Anxiety disorder, unspecified: Secondary | ICD-10-CM | POA: Insufficient documentation

## 2017-07-22 DIAGNOSIS — Z8371 Family history of colonic polyps: Secondary | ICD-10-CM | POA: Insufficient documentation

## 2017-07-22 DIAGNOSIS — K2210 Ulcer of esophagus without bleeding: Secondary | ICD-10-CM | POA: Insufficient documentation

## 2017-07-22 DIAGNOSIS — K644 Residual hemorrhoidal skin tags: Secondary | ICD-10-CM | POA: Diagnosis not present

## 2017-07-22 DIAGNOSIS — M3500 Sicca syndrome, unspecified: Secondary | ICD-10-CM | POA: Insufficient documentation

## 2017-07-22 DIAGNOSIS — F329 Major depressive disorder, single episode, unspecified: Secondary | ICD-10-CM | POA: Insufficient documentation

## 2017-07-22 DIAGNOSIS — K219 Gastro-esophageal reflux disease without esophagitis: Secondary | ICD-10-CM | POA: Insufficient documentation

## 2017-07-22 DIAGNOSIS — K921 Melena: Secondary | ICD-10-CM | POA: Insufficient documentation

## 2017-07-22 DIAGNOSIS — K317 Polyp of stomach and duodenum: Secondary | ICD-10-CM | POA: Insufficient documentation

## 2017-07-22 DIAGNOSIS — K449 Diaphragmatic hernia without obstruction or gangrene: Secondary | ICD-10-CM | POA: Insufficient documentation

## 2017-07-22 DIAGNOSIS — I10 Essential (primary) hypertension: Secondary | ICD-10-CM | POA: Insufficient documentation

## 2017-07-22 DIAGNOSIS — Z888 Allergy status to other drugs, medicaments and biological substances status: Secondary | ICD-10-CM | POA: Diagnosis not present

## 2017-07-22 DIAGNOSIS — Z1211 Encounter for screening for malignant neoplasm of colon: Secondary | ICD-10-CM | POA: Insufficient documentation

## 2017-07-22 DIAGNOSIS — Z800 Family history of malignant neoplasm of digestive organs: Secondary | ICD-10-CM

## 2017-07-22 HISTORY — PX: COLONOSCOPY: SHX5424

## 2017-07-22 HISTORY — PX: BIOPSY: SHX5522

## 2017-07-22 HISTORY — DX: Depression, unspecified: F32.A

## 2017-07-22 HISTORY — DX: Major depressive disorder, single episode, unspecified: F32.9

## 2017-07-22 HISTORY — DX: Anemia, unspecified: D64.9

## 2017-07-22 HISTORY — DX: Headache: R51

## 2017-07-22 HISTORY — PX: ESOPHAGOGASTRODUODENOSCOPY: SHX5428

## 2017-07-22 HISTORY — DX: Headache, unspecified: R51.9

## 2017-07-22 HISTORY — DX: Gastro-esophageal reflux disease without esophagitis: K21.9

## 2017-07-22 HISTORY — DX: Anxiety disorder, unspecified: F41.9

## 2017-07-22 HISTORY — DX: Personal history of other diseases of the digestive system: Z87.19

## 2017-07-22 HISTORY — DX: Essential (primary) hypertension: I10

## 2017-07-22 SURGERY — EGD (ESOPHAGOGASTRODUODENOSCOPY)
Anesthesia: Moderate Sedation

## 2017-07-22 MED ORDER — STERILE WATER FOR IRRIGATION IR SOLN
Status: DC | PRN
  Administered 2017-07-22: 08:00:00

## 2017-07-22 MED ORDER — MIDAZOLAM HCL 5 MG/5ML IJ SOLN
INTRAMUSCULAR | Status: DC | PRN
  Administered 2017-07-22: 2 mg via INTRAVENOUS
  Administered 2017-07-22: 1 mg via INTRAVENOUS
  Administered 2017-07-22: 2 mg via INTRAVENOUS
  Administered 2017-07-22 (×2): 1 mg via INTRAVENOUS

## 2017-07-22 MED ORDER — SODIUM CHLORIDE 0.9 % IV SOLN
INTRAVENOUS | Status: DC
  Administered 2017-07-22: 1000 mL via INTRAVENOUS

## 2017-07-22 MED ORDER — LIDOCAINE VISCOUS HCL 2 % MT SOLN
OROMUCOSAL | Status: DC | PRN
  Administered 2017-07-22: 4 mL via OROMUCOSAL

## 2017-07-22 MED ORDER — MIDAZOLAM HCL 5 MG/5ML IJ SOLN
INTRAMUSCULAR | Status: AC
  Filled 2017-07-22: qty 10

## 2017-07-22 MED ORDER — MEPERIDINE HCL 50 MG/ML IJ SOLN
INTRAMUSCULAR | Status: DC | PRN
  Administered 2017-07-22 (×2): 25 mg via INTRAVENOUS

## 2017-07-22 MED ORDER — MEPERIDINE HCL 50 MG/ML IJ SOLN
INTRAMUSCULAR | Status: AC
  Filled 2017-07-22: qty 1

## 2017-07-22 MED ORDER — LIDOCAINE VISCOUS HCL 2 % MT SOLN
OROMUCOSAL | Status: AC
  Filled 2017-07-22: qty 15

## 2017-07-22 NOTE — Discharge Instructions (Signed)
Hemorrhoids °Hemorrhoids are swollen veins in and around the rectum or anus. There are two types of hemorrhoids: °· Internal hemorrhoids. These occur in the veins that are just inside the rectum. They may poke through to the outside and become irritated and painful. °· External hemorrhoids. These occur in the veins that are outside of the anus and can be felt as a painful swelling or hard lump near the anus. ° °Most hemorrhoids do not cause serious problems, and they can be managed with home treatments such as diet and lifestyle changes. If home treatments do not help your symptoms, procedures can be done to shrink or remove the hemorrhoids. °What are the causes? °This condition is caused by increased pressure in the anal area. This pressure may result from various things, including: °· Constipation. °· Straining to have a bowel movement. °· Diarrhea. °· Pregnancy. °· Obesity. °· Sitting for long periods of time. °· Heavy lifting or other activity that causes you to strain. °· Anal sex. ° °What are the signs or symptoms? °Symptoms of this condition include: °· Pain. °· Anal itching or irritation. °· Rectal bleeding. °· Leakage of stool (feces). °· Anal swelling. °· One or more lumps around the anus. ° °How is this diagnosed? °This condition can often be diagnosed through a visual exam. Other exams or tests may also be done, such as: °· Examination of the rectal area with a gloved hand (digital rectal exam). °· Examination of the anal canal using a small tube (anoscope). °· A blood test, if you have lost a significant amount of blood. °· A test to look inside the colon (sigmoidoscopy or colonoscopy). ° °How is this treated? °This condition can usually be treated at home. However, various procedures may be done if dietary changes, lifestyle changes, and other home treatments do not help your symptoms. These procedures can help make the hemorrhoids smaller or remove them completely. Some of these procedures involve  surgery, and others do not. Common procedures include: °· Rubber band ligation. Rubber bands are placed at the base of the hemorrhoids to cut off the blood supply to them. °· Sclerotherapy. Medicine is injected into the hemorrhoids to shrink them. °· Infrared coagulation. A type of light energy is used to get rid of the hemorrhoids. °· Hemorrhoidectomy surgery. The hemorrhoids are surgically removed, and the veins that supply them are tied off. °· Stapled hemorrhoidopexy surgery. A circular stapling device is used to remove the hemorrhoids and use staples to cut off the blood supply to them. ° °Follow these instructions at home: °Eating and drinking °· Eat foods that have a lot of fiber in them, such as whole grains, beans, nuts, fruits, and vegetables. Ask your health care provider about taking products that have added fiber (fiber supplements). °· Drink enough fluid to keep your urine clear or pale yellow. °Managing pain and swelling °· Take warm sitz baths for 20 minutes, 3-4 times a day to ease pain and discomfort. °· If directed, apply ice to the affected area. Using ice packs between sitz baths may be helpful. °? Put ice in a plastic bag. °? Place a towel between your skin and the bag. °? Leave the ice on for 20 minutes, 2-3 times a day. °General instructions °· Take over-the-counter and prescription medicines only as told by your health care provider. °· Use medicated creams or suppositories as told. °· Exercise regularly. °· Go to the bathroom when you have the urge to have a bowel movement. Do not wait. °·   Avoid straining to have bowel movements.  Keep the anal area dry and clean. Use wet toilet paper or moist towelettes after a bowel movement.  Do not sit on the toilet for long periods of time. This increases blood pooling and pain. Contact a health care provider if:  You have increasing pain and swelling that are not controlled by treatment or medicine.  You have uncontrolled bleeding.  You  have difficulty having a bowel movement, or you are unable to have a bowel movement.  You have pain or inflammation outside the area of the hemorrhoids. This information is not intended to replace advice given to you by your health care provider. Make sure you discuss any questions you have with your health care provider. Document Released: 12/23/1999 Document Revised: 05/25/2015 Document Reviewed: 09/08/2014 Elsevier Interactive Patient Education  2018 [employer]. Colonoscopy, Adult, Care After This sheet gives you information about how to care for yourself after your procedure. Your health care provider may also give you more specific instructions. If you have problems or questions, contact your health care provider. What can I expect after the procedure? After the procedure, it is common to have:  A small amount of blood in your stool for 24 hours after the procedure.  Some gas.  Mild abdominal cramping or bloating.  Follow these instructions at home: General instructions   For the first 24 hours after the procedure: ? Do not drive or use machinery. ? Do not sign important documents. ? Do not drink alcohol. ? Do your regular daily activities at a slower pace than normal. ? Eat soft, easy-to-digest foods. ? Rest often.  Take over-the-counter or prescription medicines only as told by your health care provider.  It is up to you to get the results of your procedure. Ask your health care provider, or the department performing the procedure, when your results will be ready. Relieving cramping and bloating  Try walking around when you have cramps or feel bloated.  Apply heat to your abdomen as told by your health care provider. Use a heat source that your health care provider recommends, such as a moist heat pack or a heating pad. ? Place a towel between your skin and the heat source. ? Leave the heat on for 20-30 minutes. ? Remove the heat if your skin turns bright red. This is  especially important if you are unable to feel pain, heat, or cold. You may have a greater risk of getting burned. Eating and drinking  Drink enough fluid to keep your urine clear or pale yellow.  Resume your normal diet as instructed by your health care provider. Avoid heavy or fried foods that are hard to digest.  Avoid drinking alcohol for as long as instructed by your health care provider. Contact a health care provider if:  You have blood in your stool 2-3 days after the procedure. Get help right away if:  You have more than a small spotting of blood in your stool.  You pass large blood clots in your stool.  Your abdomen is swollen.  You have nausea or vomiting.  You have a fever.  You have increasing abdominal pain that is not relieved with medicine. This information is not intended to replace advice given to you by your health care provider. Make sure you discuss any questions you have with your health care provider. Document Released: 08/09/2003 Document Revised: 09/19/2015 Document Reviewed: 03/08/2015 Elsevier Interactive Patient Education  2018 [place] usual medications as before.  Resume usual diet. Antireflux measures- sheet No driving for 24 hours. Physician will call with biopsy results. Next colonoscopy in 5 years.

## 2017-07-22 NOTE — Op Note (Signed)
[facility] Patient Name: Jeanette FowlerJamie Suarez Procedure Date: 07/22/2017 7:51 AM MRN: [id number] Date of Birth: 11/10/1955 Attending MD: Lionel DecemberNajeeb Rehman , MD CSN: [id number] Age: 62 Admit Type: Outpatient Procedure:                Colonoscopy Indications:              Screening in patient at increased risk: Colorectal                            cancer in sister before age 60 Providers:                Lionel DecemberNajeeb Rehman, MD, Loma MessingLurae B. Patsy LagerAlbert RN, RN, Edythe ClarityKelly                            Cox, Technician Referring MD:             Donzetta Sprungerry Daniel, MD Medicines:                Midazolam 3 mg IV Complications:            No immediate complications. Estimated Blood Loss:     Estimated blood loss: none. Procedure:                Pre-Anesthesia Assessment:                           - Prior to the procedure, a History and Physical                            was performed, and patient medications and                            allergies were reviewed. The patient's tolerance of                            previous anesthesia was also reviewed. The risks                            and benefits of the procedure and the sedation                            options and risks were discussed with the patient.                            All questions were answered, and informed consent                            was obtained. Prior Anticoagulants: The patient has                            taken no previous anticoagulant or antiplatelet                            agents. ASA Grade Assessment: II - A patient with  mild systemic disease. After reviewing the risks                            and benefits, the patient was deemed in                            satisfactory condition to undergo the procedure.                           After obtaining informed consent, the colonoscope                            was passed under direct vision. Throughout the                            procedure, the  patient's blood pressure, pulse, and                            oxygen saturations were monitored continuously. The                            [id number]-[id number] ([id number]) scope was introduced through                            the anus and advanced to the the terminal ileum,                            with identification of the appendiceal orifice and                            IC valve. The colonoscopy was performed without                            difficulty. The patient tolerated the procedure                            well. The quality of the bowel preparation was                            excellent. The terminal ileum, ileocecal valve,                            appendiceal orifice, and rectum were photographed. Scope In: 7:51:54 AM Scope Out: 8:09:21 AM Scope Withdrawal Time: 0 hours 8 minutes 58 seconds  Total Procedure Duration: 0 hours 17 minutes 27 seconds  Findings:      The perianal and digital rectal examinations were normal.      The terminal ileum appeared normal.      The colon (entire examined portion) appeared normal.      External hemorrhoids were found during retroflexion. The hemorrhoids       were small. Impression:               - The examined portion of the ileum was normal.                           -  The entire examined colon is normal.                           - External hemorrhoids.                           - No specimens collected. Moderate Sedation:      Moderate (conscious) sedation was administered by the endoscopy nurse       and supervised by the endoscopist. The following parameters were       monitored: oxygen saturation, heart rate, blood pressure, CO2       capnography and response to care. Total physician intraservice time was       23 minutes. Recommendation:           - Patient has a contact number available for                            emergencies. The signs and symptoms of potential                            delayed complications were  discussed with the                            patient. Return to normal activities tomorrow.                            Written discharge instructions were provided to the                            patient.                           - Resume previous diet today.                           - Continue present medications.                           - Repeat colonoscopy in 5 years for screening                            purposes. Procedure Code(s):        --- Professional ---                           [phone], Colonoscopy, flexible; diagnostic, including                            collection of specimen(s) by brushing or washing,                            when performed (separate procedure)                           G0500, Moderate sedation services provided by the  same physician or other qualified health care                            professional performing a gastrointestinal                            endoscopic service that sedation supports,                            requiring the presence of an independent trained                            observer to assist in the monitoring of the                            patient's level of consciousness and physiological                            status; initial 15 minutes of intra-service time;                            patient age 92 years or older (additional time may                            be reported with [phone], as appropriate)                           [phone], Moderate sedation services provided by the                            same physician or other qualified health care                            professional performing the diagnostic or                            therapeutic service that the sedation supports,                            requiring the presence of an independent trained                            observer to assist in the monitoring of the                            patient's level of  consciousness and physiological                            status; each additional 15 minutes intraservice                            time (List separately in addition to code for  primary service) Diagnosis Code(s):        --- Professional ---                           Z80.0, Family history of malignant neoplasm of                            digestive organs                           K64.4, Residual hemorrhoidal skin tags CPT copyright 2017 American Medical Association. All rights reserved. The codes documented in this report are preliminary and upon coder review may  be revised to meet current compliance requirements. Lionel December, MD Lionel December, MD 07/22/2017 8:24:53 AM This report has been signed electronically. Number of Addenda: 0

## 2017-07-22 NOTE — Op Note (Signed)
[facility] Patient Name: Jeanette Suarez Procedure Date: 07/22/2017 7:30 AM MRN: [id number] Date of Birth: [birth year] Attending MD: Lionel December , MD CSN: [id number] Age: 62 Admit Type: Outpatient Procedure:                Upper GI endoscopy Indications:              Melena Providers:                Lionel December, MD, Criselda Peaches. Patsy Lager, RN, Edythe Clarity, Technician Referring MD:             Donzetta Sprung, MD Medicines:                Lidocaine spray, Meperidine 50 mg IV, Midazolam 4                            mg IV Complications:            No immediate complications. Estimated Blood Loss:     Estimated blood loss was minimal. Procedure:                Pre-Anesthesia Assessment:                           - Prior to the procedure, a History and Physical                            was performed, and patient medications and                            allergies were reviewed. The patient's tolerance of                            previous anesthesia was also reviewed. The risks                            and benefits of the procedure and the sedation                            options and risks were discussed with the patient.                            All questions were answered, and informed consent                            was obtained. Prior Anticoagulants: The patient has                            taken no previous anticoagulant or antiplatelet                            agents. ASA Grade Assessment: II - A patient with  mild systemic disease. After reviewing the risks                            and benefits, the patient was deemed in                            satisfactory condition to undergo the procedure.                           After obtaining informed consent, the endoscope was                            passed under direct vision. Throughout the                            procedure, the patient's blood pressure,  pulse, and                            oxygen saturations were monitored continuously. The                            [id number]-H190 ([id number]) scope was introduced through the                            mouth, and advanced to the second part of duodenum.                            The upper GI endoscopy was accomplished without                            difficulty. The patient tolerated the procedure                            well. Scope In: 7:41:20 AM Scope Out: 7:46:51 AM Total Procedure Duration: 0 hours 5 minutes 31 seconds  Findings:      A single small erosion was found at the gastroesophageal junction. GEJ       at 34 cm from incisors.      The exam of the esophagus was otherwise normal.      A 2 cm hiatal hernia was present.      Two small sessile polyps were found in the gastric body. Biopsies were       taken with a cold forceps for histology. The pathology specimen was       placed into Bottle Number 1.      The exam of the stomach was otherwise normal.      The duodenal bulb and second portion of the duodenum were normal. Impression:               - A single erosion at the gastroesophageal junction.                           - 2 cm hiatal hernia.                           - Two  gastric polyps. Biopsied.                           - Normal duodenal bulb and second portion of the                            duodenum. Moderate Sedation:      Moderate (conscious) sedation was administered by the endoscopy nurse       and supervised by the endoscopist. The following parameters were       monitored: oxygen saturation, heart rate, blood pressure, CO2       capnography and response to care. Total physician intraservice time was       11 minutes. Recommendation:           - Patient has a contact number available for                            emergencies. The signs and symptoms of potential                            delayed complications were discussed with the                             patient. Return to normal activities tomorrow.                            Written discharge instructions were provided to the                            patient.                           - Resume previous diet today.                           - Continue present medications.                           - Anti-reflux measures.                           - No aspirin, ibuprofen, naproxen, or other                            non-steroidal anti-inflammatory drugs for 1 day.                           - Await pathology results. Procedure Code(s):        --- Professional ---                           [phone], Esophagogastroduodenoscopy, flexible,                            transoral; with biopsy, single or multiple  G0500, Moderate sedation services provided by the                            same physician or other qualified health care                            professional performing a gastrointestinal                            endoscopic service that sedation supports,                            requiring the presence of an independent trained                            observer to assist in the monitoring of the                            patient's level of consciousness and physiological                            status; initial 15 minutes of intra-service time;                            patient age 80 years or older (additional time may                            be reported with [id number], as appropriate) Diagnosis Code(s):        --- Professional ---                           K22.10, Ulcer of esophagus without bleeding                           K44.9, Diaphragmatic hernia without obstruction or                            gangrene                           K31.7, Polyp of stomach and duodenum                           K92.1, Melena (includes Hematochezia) CPT copyright 2017 American Medical Association. All rights reserved. The codes documented in this report are  preliminary and upon coder review may  be revised to meet current compliance requirements. Lionel December, MD Lionel December, MD 07/22/2017 8:20:58 AM This report has been signed electronically. Number of Addenda: 0

## 2017-07-22 NOTE — H&P (Signed)
Jeanette Suarez is an 62 y.o. female.   Chief Complaint: Patient is here for EGD and colonoscopy. HPI: Patient is a 62 year old Caucasian female who presents with 3-day history of melena which occurred over 7 weeks ago when she was seen in emergency room.  Her H&H was normal.  She has chronic GERD.  She is on a PPI with as needed ranitidine at bedtime.  She denies dysphagia.  When she had melena she also had nausea vomiting and diarrhea and these acute symptoms are resolved.  She is also undergoing screening colonoscopy.  Last exam was in 2009.  She was advised to return in 5 years with her insurance would not approve.  She denies rectal bleeding. Family history significant for CRC and sister who was 32 at the time of diagnosis and is doing fine in her mid 15s. Her brother has had multiple adenomas and is undergoing surveillance colonoscopy every 3 years.  Past Medical History:  Diagnosis Date  . Anemia   . Anxiety   . Depression   . Family hx of colon cancer   . GERD (gastroesophageal reflux disease)   . Headache   . History of hiatal hernia   . Hypertension   . Sjogren's disease [facility])     Past Surgical History:  Procedure Laterality Date  . ABDOMINAL HYSTERECTOMY    . BUNIONECTOMY Bilateral 1992  . eye Bilateral    lasix    No family history on file. Social History:  reports that she has never smoked. She has never used smokeless tobacco. She reports that she does not drink alcohol or use drugs.  Allergies:  Allergies  Allergen Reactions  . Ace Inhibitors Cough  . Sulfa Antibiotics Swelling, Rash and Cough    Swelling to hands and lips.    Medications Prior to Admission  Medication Sig Dispense Refill  . ALPRAZolam (XANAX) 0.5 MG tablet Take 0.25 mg by mouth 2 (two) times daily as needed.     Marland Kitchen amLODipine (NORVASC) 5 MG tablet Take 1 tablet by mouth daily.    Marland Kitchen buPROPion (WELLBUTRIN XL) 150 MG 24 hr tablet Take 150 mg by mouth daily.    . cevimeline (EVOXAC) 30 MG capsule  Take 30 mg by mouth 4 (four) times daily.    . cycloSPORINE (RESTASIS) 0.05 % ophthalmic emulsion Place 1 drop into both eyes every morning.    Marland Kitchen dexlansoprazole (DEXILANT) 60 MG capsule Take 60 mg by mouth daily.    . Estradiol (ELESTRIN) 0.52 MG/0.87 GM (0.06%) GEL Place 2 Pump onto the skin daily.     . Hypromellose (NATURAL BALANCE TEARS) 0.4 % SOLN Place 1 drop into both eyes 2 (two) times daily as needed (dry eyes).    . ranitidine (ZANTAC) 150 MG tablet Take 150 mg by mouth at bedtime.    . sertraline (ZOLOFT) 100 MG tablet Take 200 mg by mouth daily.       No results found for this or any previous visit (from the past 48 hour(s)). No results found.  ROS  Blood pressure (!) 144/88, pulse 91, temperature 98.2 F (36.8 C), temperature source Oral, resp. rate 15, height 5' 4.5" (1.638 m), weight 146 lb (66.2 kg), SpO2 100 %. Physical Exam  Constitutional: She appears well-developed and well-nourished.  HENT:  Mouth/Throat: Oropharynx is clear and moist.  Eyes: Conjunctivae are normal. No scleral icterus.  Neck: No thyromegaly present.  Cardiovascular: Normal rate, regular rhythm and normal heart sounds.  No murmur heard. Respiratory: Effort  normal and breath sounds normal.  GI: Soft. She exhibits no distension and no mass. There is no tenderness.  Musculoskeletal: She exhibits no edema.  Lymphadenopathy:    She has no cervical adenopathy.  Neurological: She is alert.  Skin: Skin is warm and dry.     Assessment/Plan History of melena.  Chronic GERD. Diagnostic EGD and high screening colonoscopy.  Lionel DecemberNajeeb Dwyane Dupree, MD 07/22/2017, 7:30 AM

## 2017-07-26 ENCOUNTER — Encounter (HOSPITAL_COMMUNITY): Payer: Self-pay | Admitting: Internal Medicine

## 2018-03-03 DIAGNOSIS — E782 Mixed hyperlipidemia: Secondary | ICD-10-CM | POA: Diagnosis not present

## 2018-03-03 DIAGNOSIS — I10 Essential (primary) hypertension: Secondary | ICD-10-CM | POA: Diagnosis not present

## 2018-03-03 DIAGNOSIS — D500 Iron deficiency anemia secondary to blood loss (chronic): Secondary | ICD-10-CM | POA: Diagnosis not present

## 2018-03-03 DIAGNOSIS — D649 Anemia, unspecified: Secondary | ICD-10-CM | POA: Diagnosis not present

## 2018-04-21 DIAGNOSIS — E782 Mixed hyperlipidemia: Secondary | ICD-10-CM | POA: Diagnosis not present

## 2018-04-21 DIAGNOSIS — D500 Iron deficiency anemia secondary to blood loss (chronic): Secondary | ICD-10-CM | POA: Diagnosis not present

## 2018-04-21 DIAGNOSIS — E538 Deficiency of other specified B group vitamins: Secondary | ICD-10-CM | POA: Diagnosis not present

## 2018-04-21 DIAGNOSIS — D649 Anemia, unspecified: Secondary | ICD-10-CM | POA: Diagnosis not present

## 2018-04-21 DIAGNOSIS — I10 Essential (primary) hypertension: Secondary | ICD-10-CM | POA: Diagnosis not present

## 2018-04-24 DIAGNOSIS — K219 Gastro-esophageal reflux disease without esophagitis: Secondary | ICD-10-CM | POA: Diagnosis not present

## 2018-04-24 DIAGNOSIS — E782 Mixed hyperlipidemia: Secondary | ICD-10-CM | POA: Diagnosis not present

## 2018-04-24 DIAGNOSIS — F331 Major depressive disorder, recurrent, moderate: Secondary | ICD-10-CM | POA: Diagnosis not present

## 2018-04-24 DIAGNOSIS — I10 Essential (primary) hypertension: Secondary | ICD-10-CM | POA: Diagnosis not present

## 2018-07-18 DIAGNOSIS — I10 Essential (primary) hypertension: Secondary | ICD-10-CM | POA: Diagnosis not present

## 2018-07-18 DIAGNOSIS — D649 Anemia, unspecified: Secondary | ICD-10-CM | POA: Diagnosis not present

## 2018-07-18 DIAGNOSIS — E782 Mixed hyperlipidemia: Secondary | ICD-10-CM | POA: Diagnosis not present

## 2018-07-23 DIAGNOSIS — E782 Mixed hyperlipidemia: Secondary | ICD-10-CM | POA: Diagnosis not present

## 2018-07-23 DIAGNOSIS — F331 Major depressive disorder, recurrent, moderate: Secondary | ICD-10-CM | POA: Diagnosis not present

## 2018-07-23 DIAGNOSIS — I10 Essential (primary) hypertension: Secondary | ICD-10-CM | POA: Diagnosis not present

## 2018-07-23 DIAGNOSIS — K219 Gastro-esophageal reflux disease without esophagitis: Secondary | ICD-10-CM | POA: Diagnosis not present

## 2018-09-03 DIAGNOSIS — Z79890 Hormone replacement therapy (postmenopausal): Secondary | ICD-10-CM | POA: Diagnosis not present

## 2018-09-03 DIAGNOSIS — Z780 Asymptomatic menopausal state: Secondary | ICD-10-CM | POA: Diagnosis not present

## 2018-09-03 DIAGNOSIS — Z6826 Body mass index (BMI) 26.0-26.9, adult: Secondary | ICD-10-CM | POA: Diagnosis not present

## 2018-09-03 DIAGNOSIS — Z1231 Encounter for screening mammogram for malignant neoplasm of breast: Secondary | ICD-10-CM | POA: Diagnosis not present

## 2018-09-03 DIAGNOSIS — Z01419 Encounter for gynecological examination (general) (routine) without abnormal findings: Secondary | ICD-10-CM | POA: Diagnosis not present

## 2018-11-21 DIAGNOSIS — E782 Mixed hyperlipidemia: Secondary | ICD-10-CM | POA: Diagnosis not present

## 2018-11-21 DIAGNOSIS — K219 Gastro-esophageal reflux disease without esophagitis: Secondary | ICD-10-CM | POA: Diagnosis not present

## 2018-11-21 DIAGNOSIS — M3501 Sicca syndrome with keratoconjunctivitis: Secondary | ICD-10-CM | POA: Diagnosis not present

## 2018-11-21 DIAGNOSIS — D500 Iron deficiency anemia secondary to blood loss (chronic): Secondary | ICD-10-CM | POA: Diagnosis not present

## 2018-11-21 DIAGNOSIS — F331 Major depressive disorder, recurrent, moderate: Secondary | ICD-10-CM | POA: Diagnosis not present

## 2018-11-21 DIAGNOSIS — Z23 Encounter for immunization: Secondary | ICD-10-CM | POA: Diagnosis not present

## 2018-11-21 DIAGNOSIS — I10 Essential (primary) hypertension: Secondary | ICD-10-CM | POA: Diagnosis not present

## 2018-12-19 DIAGNOSIS — Z6825 Body mass index (BMI) 25.0-25.9, adult: Secondary | ICD-10-CM | POA: Diagnosis not present

## 2018-12-19 DIAGNOSIS — N3090 Cystitis, unspecified without hematuria: Secondary | ICD-10-CM | POA: Diagnosis not present

## 2019-04-02 ENCOUNTER — Other Ambulatory Visit (HOSPITAL_COMMUNITY): Payer: Self-pay | Admitting: Family Medicine

## 2019-04-02 ENCOUNTER — Other Ambulatory Visit: Payer: Self-pay | Admitting: Family Medicine

## 2019-04-02 DIAGNOSIS — R42 Dizziness and giddiness: Secondary | ICD-10-CM

## 2019-04-17 ENCOUNTER — Ambulatory Visit (HOSPITAL_COMMUNITY): Payer: 59

## 2019-05-06 ENCOUNTER — Ambulatory Visit (HOSPITAL_COMMUNITY): Payer: 59

## 2019-05-06 ENCOUNTER — Encounter (HOSPITAL_COMMUNITY): Payer: Self-pay

## 2019-05-12 ENCOUNTER — Other Ambulatory Visit: Payer: Self-pay

## 2019-05-12 ENCOUNTER — Ambulatory Visit (HOSPITAL_COMMUNITY)
Admission: RE | Admit: 2019-05-12 | Discharge: 2019-05-12 | Disposition: A | Discharge status: Home / Self Care | Payer: 59 | Source: Ambulatory Visit | Attending: Family Medicine | Admitting: Family Medicine

## 2019-05-12 DIAGNOSIS — R42 Dizziness and giddiness: Secondary | ICD-10-CM | POA: Insufficient documentation

## 2019-05-12 IMAGING — MR MR HEAD W/O CM
11 series · 48 of 48 positions shown · non-contrast
Comparison: None.

CLINICAL DATA: Dizziness.  Fell in tub 3-4 months ago.

EXAM:
MRI HEAD WITHOUT CONTRAST
TECHNIQUE: Multiplanar, multiecho pulse sequences of the brain and surrounding
structures were obtained without intravenous contrast.

[Series 5: DWI · axial · 3.0mm · 1.36mm/px · z∈[-71,+82]mm · 7 of 104 slices shown (1 of 4)]
[im 1/104]
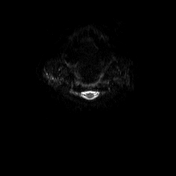
[im 18/104]
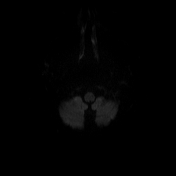
[im 35/104]
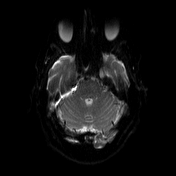
[im 52/104]
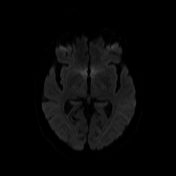
[im 69/104]
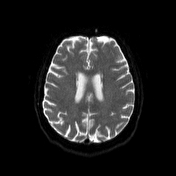
[im 86/104]
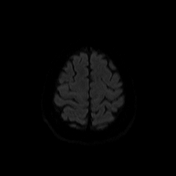
[im 104/104]
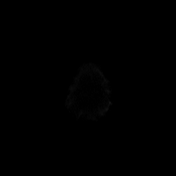

[Series 6: DWI · axial · 3.0mm · 1.36mm/px · z∈[-71,+82]mm · 3 of 51 slices shown (2 of 4)]
[im 1/51]
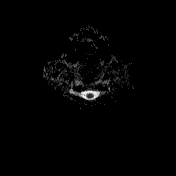
[im 26/51]
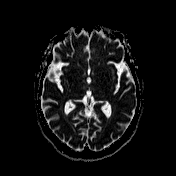
[im 51/51]
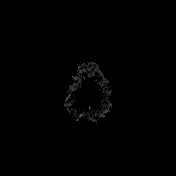

[Series 7: T1 · sagittal · 5.0mm · 0.75mm/px · 1 of 24 slices shown (1 of 2)]
[im 1/24]
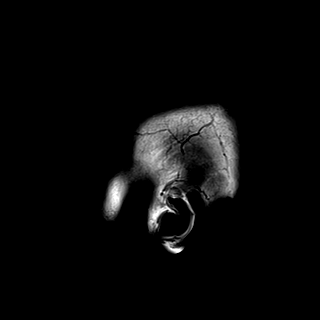

[Series 8: T2 · axial · 5.0mm · 0.62mm/px · z∈[-75,+80]mm · 2 of 25 slices shown (1 of 2)]
[im 1/25]
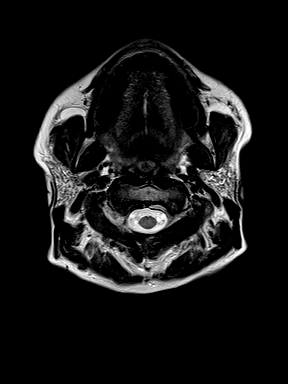
[im 25/25]
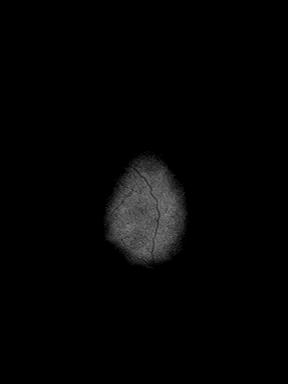

[Series 9: mip_images(sw) · axial · 24.0mm · 0.75mm/px · z∈[-69,+74]mm · 4 of 49 slices shown]
[im 1/49]
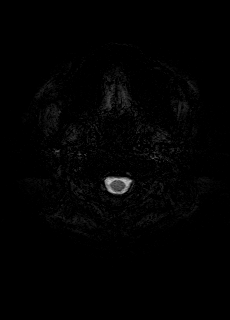
[im 17/49]
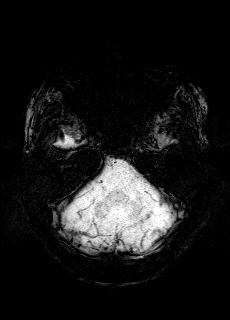
[im 33/49]
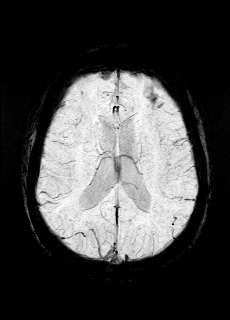
[im 49/49]
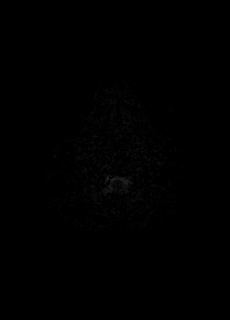

[Series 10: swi_images · axial · 3.0mm · 0.75mm/px · z∈[-80,+84]mm · 4 of 56 slices shown]
[im 1/56]
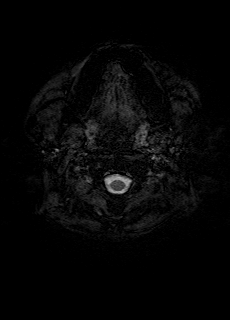
[im 19/56]
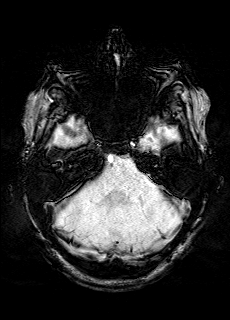
[im 37/56]
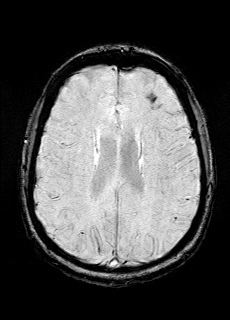
[im 56/56]
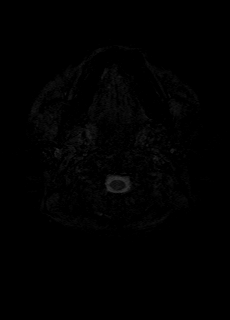

[Series 11: FLAIR · axial · 3.2mm · 0.75mm/px · z∈[-79,+83]mm · 4 of 52 slices shown]
[im 1/52]
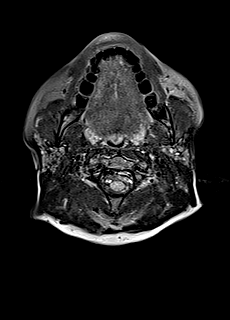
[im 18/52]
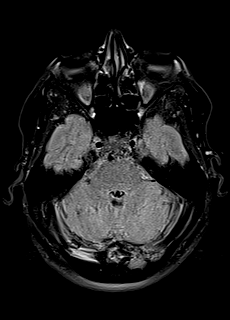
[im 35/52]
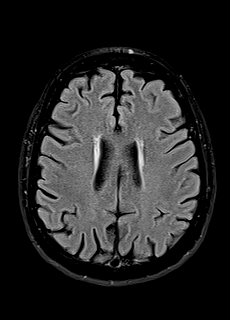
[im 52/52]
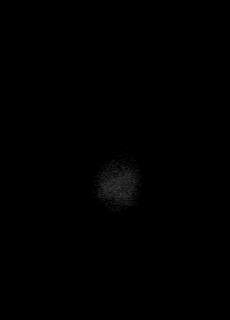

[Series 12: T1 · axial · 1.0mm · 0.94mm/px · z∈[-77,+81]mm · 12 of 160 slices shown (2 of 2)]
[im 1/160]
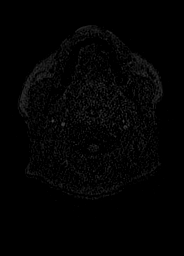
[im 15/160]
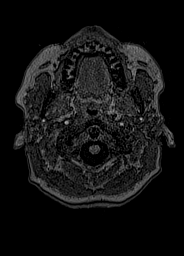
[im 29/160]
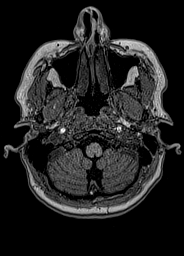
[im 44/160]
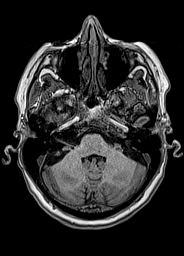
[im 58/160]
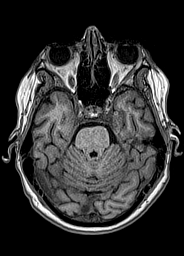
[im 73/160]
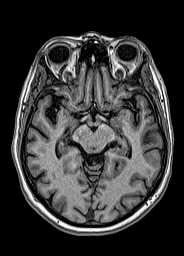
[im 87/160]
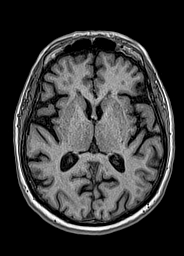
[im 102/160]
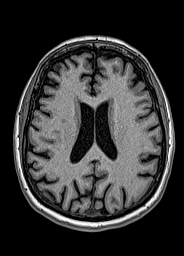
[im 116/160]
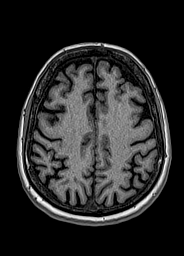
[im 131/160]
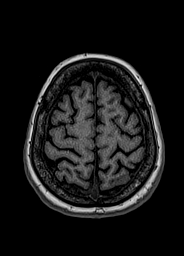
[im 145/160]
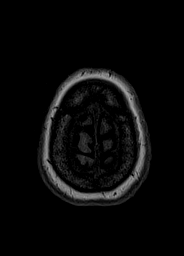
[im 160/160]
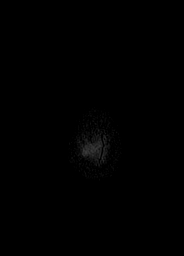

[Series 13: DWI · coronal · 5.0mm · 1.31mm/px · 5 of 71 slices shown (3 of 4)]
[im 1/71]
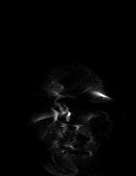
[im 18/71]
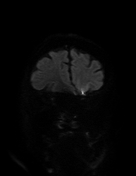
[im 36/71]
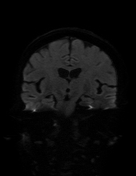
[im 53/71]
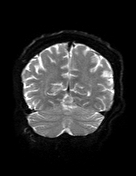
[im 71/71]
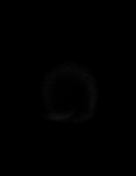

[Series 14: DWI · coronal · 5.0mm · 1.31mm/px · 3 of 36 slices shown (4 of 4)]
[im 1/36]
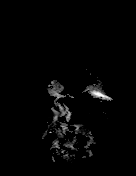
[im 18/36]
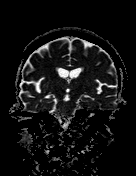
[im 36/36]
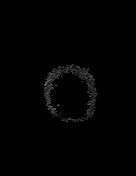

[Series 15: T2 · coronal · 5.0mm · 0.57mm/px · 3 of 36 slices shown (2 of 2)]
[im 1/36]
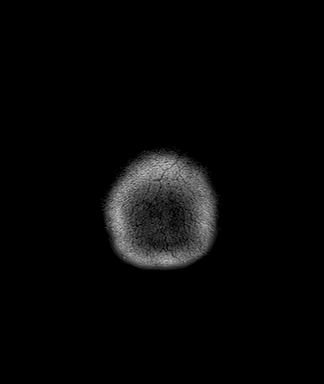
[im 18/36]
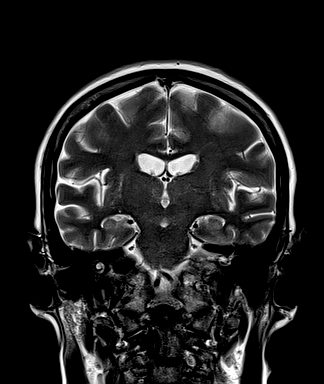
[im 36/36]
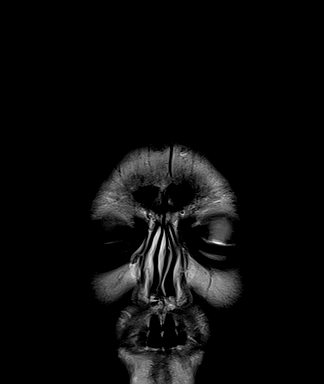

[48 of 48 positions shown; findings below may reference images not displayed]

FINDINGS: Brain: Mild atrophy without hydrocephalus. Mild chronic
microvascular ischemic change in the white matter. Negative for
acute infarct, hemorrhage, or mass.

Vascular: Normal arterial flow voids

Skull and upper cervical spine: Negative

Sinuses/Orbits: Negative

Other: None
IMPRESSION: Mild atrophy and mild chronic microvascular ischemic change in the
white matter. No acute abnormality.

## 2019-05-20 ENCOUNTER — Other Ambulatory Visit: Payer: Self-pay

## 2019-05-20 ENCOUNTER — Emergency Department (HOSPITAL_COMMUNITY): Payer: 59

## 2019-05-20 ENCOUNTER — Encounter (HOSPITAL_COMMUNITY): Payer: Self-pay | Admitting: Emergency Medicine

## 2019-05-20 ENCOUNTER — Inpatient Hospital Stay (HOSPITAL_COMMUNITY)
Admission: EM | Admit: 2019-05-20 | Discharge: 2019-05-24 | DRG: 871 | Disposition: A | Discharge status: Home / Self Care | Payer: 59 | Attending: Family Medicine | Admitting: Family Medicine

## 2019-05-20 DIAGNOSIS — G9341 Metabolic encephalopathy: Secondary | ICD-10-CM | POA: Diagnosis present

## 2019-05-20 DIAGNOSIS — A419 Sepsis, unspecified organism: Secondary | ICD-10-CM | POA: Diagnosis not present

## 2019-05-20 DIAGNOSIS — Z91048 Other nonmedicinal substance allergy status: Secondary | ICD-10-CM | POA: Diagnosis not present

## 2019-05-20 DIAGNOSIS — R52 Pain, unspecified: Secondary | ICD-10-CM

## 2019-05-20 DIAGNOSIS — M25559 Pain in unspecified hip: Secondary | ICD-10-CM | POA: Diagnosis present

## 2019-05-20 DIAGNOSIS — M3500 Sicca syndrome, unspecified: Secondary | ICD-10-CM | POA: Diagnosis present

## 2019-05-20 DIAGNOSIS — A4151 Sepsis due to Escherichia coli [E. coli]: Secondary | ICD-10-CM | POA: Diagnosis not present

## 2019-05-20 DIAGNOSIS — I10 Essential (primary) hypertension: Secondary | ICD-10-CM | POA: Diagnosis present

## 2019-05-20 DIAGNOSIS — N390 Urinary tract infection, site not specified: Secondary | ICD-10-CM | POA: Diagnosis present

## 2019-05-20 DIAGNOSIS — Z20822 Contact with and (suspected) exposure to covid-19: Secondary | ICD-10-CM | POA: Diagnosis present

## 2019-05-20 DIAGNOSIS — R413 Other amnesia: Secondary | ICD-10-CM

## 2019-05-20 DIAGNOSIS — K219 Gastro-esophageal reflux disease without esophagitis: Secondary | ICD-10-CM | POA: Diagnosis present

## 2019-05-20 DIAGNOSIS — E876 Hypokalemia: Secondary | ICD-10-CM | POA: Diagnosis present

## 2019-05-20 DIAGNOSIS — F418 Other specified anxiety disorders: Secondary | ICD-10-CM | POA: Diagnosis present

## 2019-05-20 DIAGNOSIS — Z8249 Family history of ischemic heart disease and other diseases of the circulatory system: Secondary | ICD-10-CM

## 2019-05-20 DIAGNOSIS — Z79899 Other long term (current) drug therapy: Secondary | ICD-10-CM

## 2019-05-20 DIAGNOSIS — Z882 Allergy status to sulfonamides status: Secondary | ICD-10-CM | POA: Diagnosis not present

## 2019-05-20 LAB — CBC WITH DIFFERENTIAL/PLATELET
Abs Immature Granulocytes: 0.15 10*3/uL — ABNORMAL HIGH (ref 0.00–0.07)
Basophils Absolute: 0.1 10*3/uL (ref 0.0–0.1)
Basophils Relative: 0 %
Eosinophils Absolute: 0.1 10*3/uL (ref 0.0–0.5)
Eosinophils Relative: 0 %
HCT: 40.4 % (ref 36.0–46.0)
Hemoglobin: 12.6 g/dL (ref 12.0–15.0)
Immature Granulocytes: 1 %
Lymphocytes Relative: 4 %
Lymphs Abs: 0.9 10*3/uL (ref 0.7–4.0)
MCH: 25.5 pg — ABNORMAL LOW (ref 26.0–34.0)
MCHC: 31.2 g/dL (ref 30.0–36.0)
MCV: 81.6 fL (ref 80.0–100.0)
Monocytes Absolute: 1.4 10*3/uL — ABNORMAL HIGH (ref 0.1–1.0)
Monocytes Relative: 6 %
Neutro Abs: 21.1 10*3/uL — ABNORMAL HIGH (ref 1.7–7.7)
Neutrophils Relative %: 89 %
Platelets: 280 10*3/uL (ref 150–400)
RBC: 4.95 MIL/uL (ref 3.87–5.11)
RDW: 14.6 % (ref 11.5–15.5)
WBC: 23.6 10*3/uL — ABNORMAL HIGH (ref 4.0–10.5)
nRBC: 0 % (ref 0.0–0.2)

## 2019-05-20 LAB — URINALYSIS, ROUTINE W REFLEX MICROSCOPIC
Bilirubin Urine: NEGATIVE
Glucose, UA: NEGATIVE mg/dL
Ketones, ur: 5 mg/dL — AB
Nitrite: POSITIVE — AB
Protein, ur: 100 mg/dL — AB
Specific Gravity, Urine: 1.01 (ref 1.005–1.030)
WBC, UA: >50 WBC/hpf — ABNORMAL HIGH (ref 0–5)
pH: 6 (ref 5.0–8.0)

## 2019-05-20 LAB — MAGNESIUM: Magnesium: 1.9 mg/dL (ref 1.7–2.4)

## 2019-05-20 LAB — COMPREHENSIVE METABOLIC PANEL
ALT: 19 U/L (ref 0–44)
AST: 35 U/L (ref 15–41)
Albumin: 3.9 g/dL (ref 3.5–5.0)
Alkaline Phosphatase: 84 U/L (ref 38–126)
Anion gap: 9 (ref 5–15)
BUN: 15 mg/dL (ref 8–23)
CO2: 26 mmol/L (ref 22–32)
Calcium: 8.7 mg/dL — ABNORMAL LOW (ref 8.9–10.3)
Chloride: 102 mmol/L (ref 98–111)
Creatinine, Ser: 1.12 mg/dL — ABNORMAL HIGH (ref 0.44–1.00)
GFR calc Af Amer: >60 mL/min (ref >60)
GFR calc non Af Amer: 53 mL/min — ABNORMAL LOW (ref >60)
Glucose, Bld: 112 mg/dL — ABNORMAL HIGH (ref 70–99)
Potassium: 3.1 mmol/L — ABNORMAL LOW (ref 3.5–5.1)
Sodium: 137 mmol/L (ref 135–145)
Total Bilirubin: 0.8 mg/dL (ref 0.3–1.2)
Total Protein: 7.7 g/dL (ref 6.5–8.1)

## 2019-05-20 LAB — SARS CORONAVIRUS 2 BY RT PCR (HOSPITAL ORDER, PERFORMED IN ~~LOC~~ HOSPITAL LAB): SARS Coronavirus 2: NEGATIVE

## 2019-05-20 LAB — PROTIME-INR
INR: 1 (ref 0.8–1.2)
Prothrombin Time: 13.2 seconds (ref 11.4–15.2)

## 2019-05-20 LAB — LACTIC ACID, PLASMA
Lactic Acid, Venous: 0.9 mmol/L (ref 0.5–1.9)
Lactic Acid, Venous: 0.9 mmol/L (ref 0.5–1.9)

## 2019-05-20 LAB — PHOSPHORUS: Phosphorus: 2.8 mg/dL (ref 2.5–4.6)

## 2019-05-20 LAB — APTT: aPTT: 32 seconds (ref 24–36)

## 2019-05-20 IMAGING — DX DG HIP (WITH OR WITHOUT PELVIS) 2-3V*L*
3 series · 3 of 3 positions shown · non-contrast
Comparison: None.

CLINICAL DATA: Left hip pain with urinary frequency, history of
hysterectomy and colon cancer

EXAM:
DG HIP (WITH OR WITHOUT PELVIS) 2-3V LEFT

[pelvis ap]
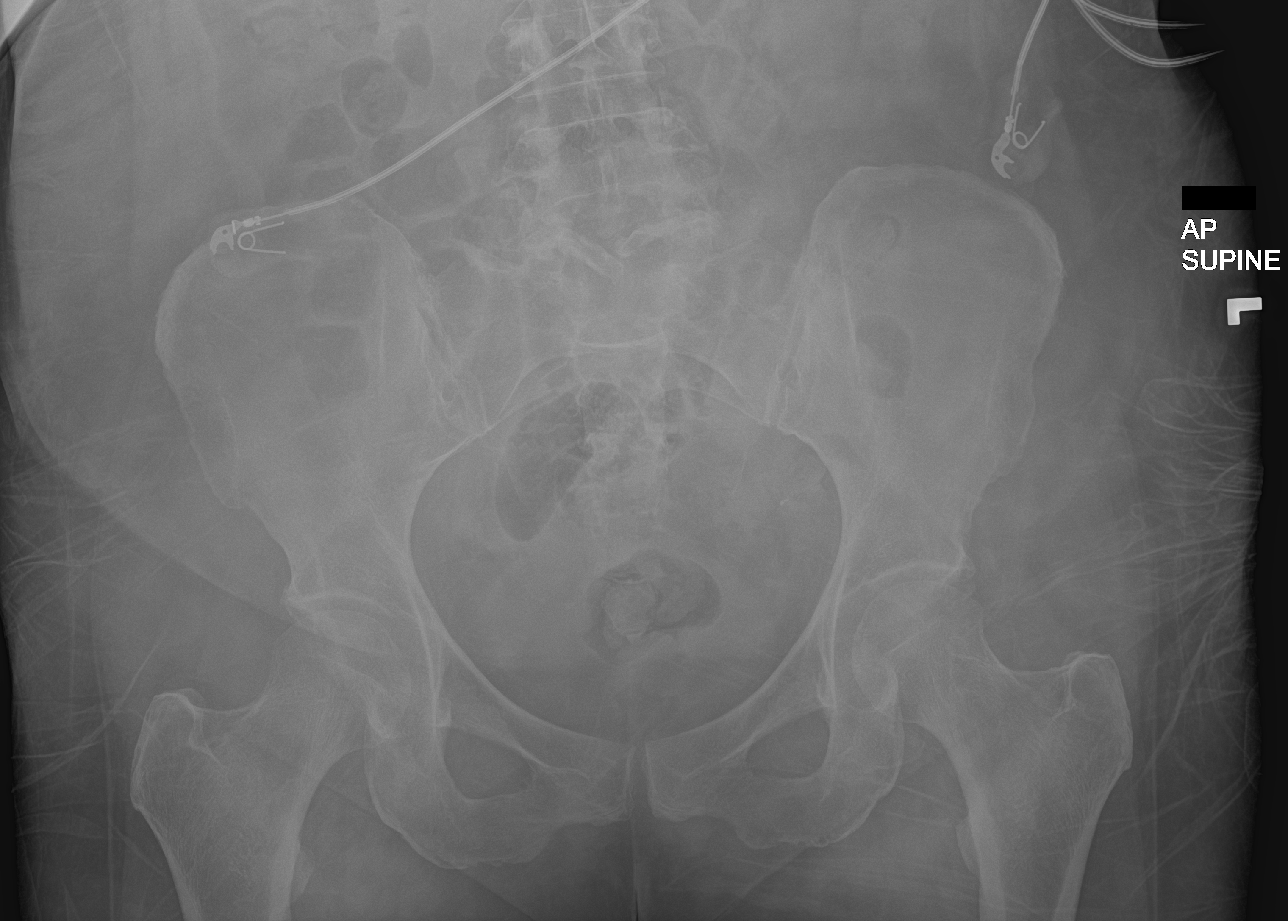

[hip ap (1 of 2)]
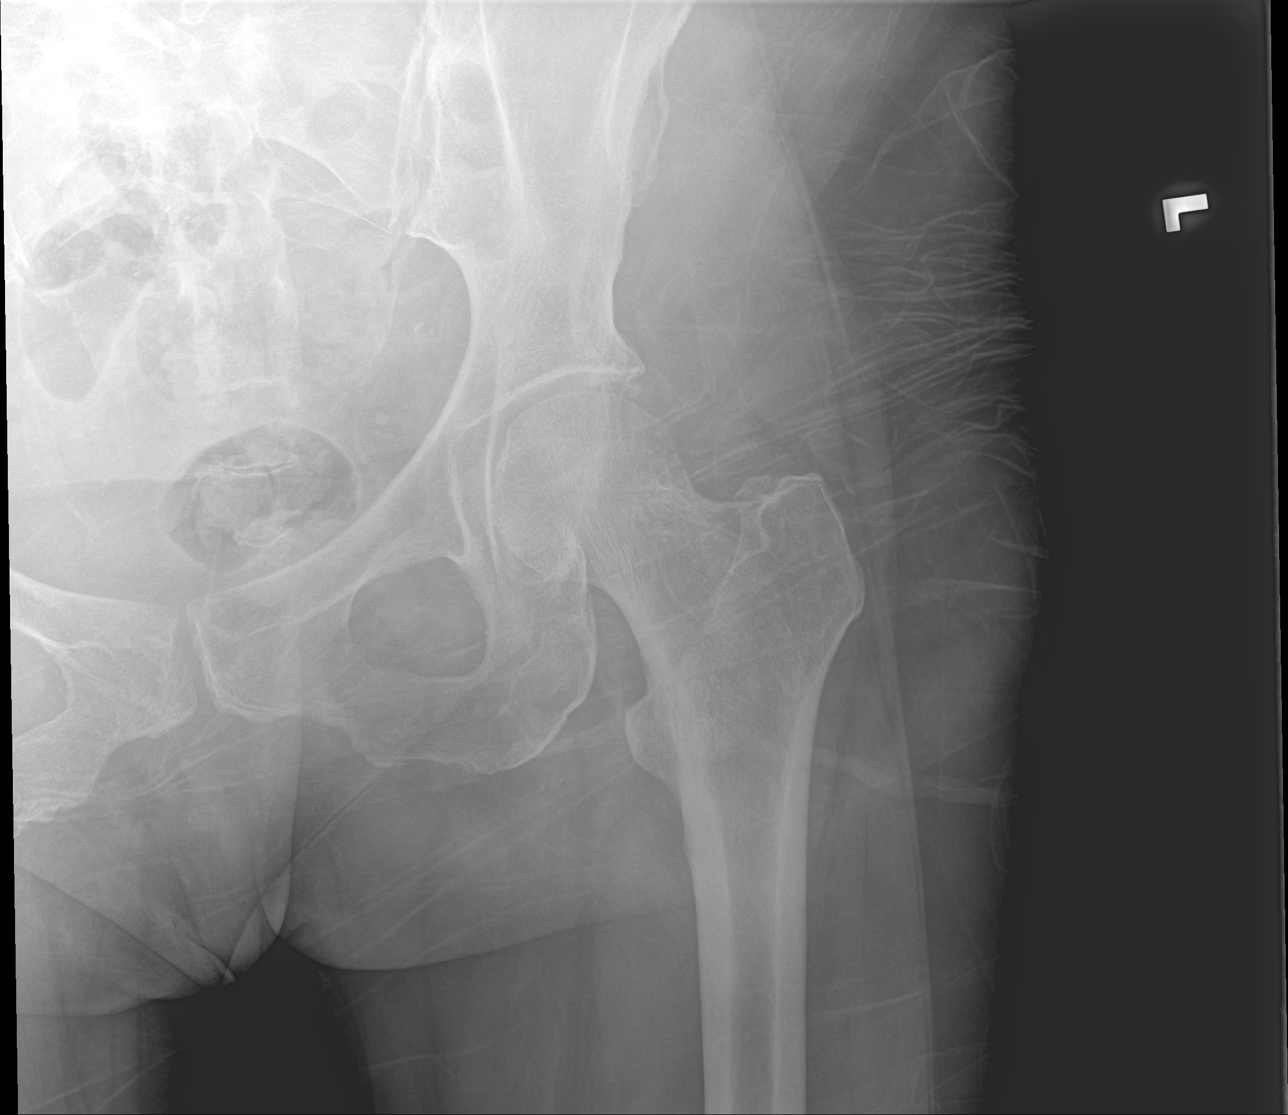

[hip ap (2 of 2)]
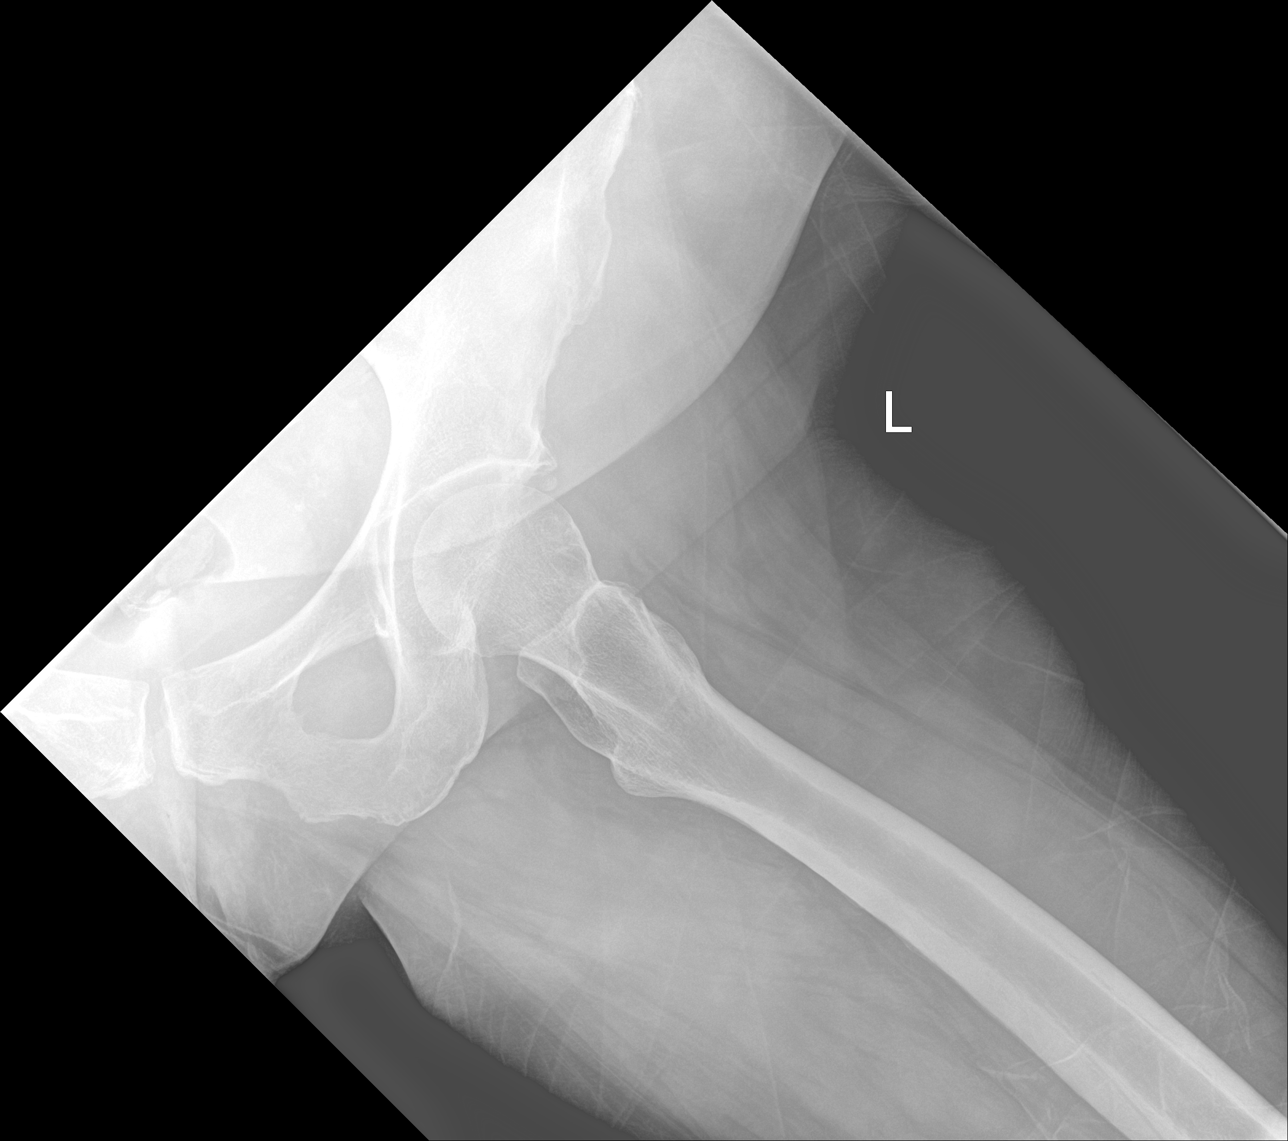

[3 of 3 positions shown; findings below may reference images not displayed]

FINDINGS: No acute bony abnormality. Specifically, no fracture, subluxation,
or dislocation. Moderate bilateral hip arthrosis is present with
corticated fragments along the superior acetabular margins which
could reflect degenerative os acetabuli. No suspicious osseous
lesions are clearly evident. Additional degenerative changes noted
in the lower lumbar spine SI joints and symphysis pubis. Incomplete
fusion of the posterior arch L5 is likely on the spectrum of spina
bifida occulta though likely a benign incidental finding in a
patient of this age. Atherosclerotic calcifications seen in the
pelvis.
IMPRESSION: 1. No acute osseous abnormality.
2. Moderate bilateral hip arthrosis with degenerative os acetabuli.
3. Incomplete fusion posterior arch L5, congenital variant.

## 2019-05-20 IMAGING — DX DG CHEST 1V PORT
1 series · 1 of 1 positions shown · non-contrast
Comparison: None.

CLINICAL DATA: Generalized body aches and chills.

EXAM:
PORTABLE CHEST 1 VIEW

[chest ap]
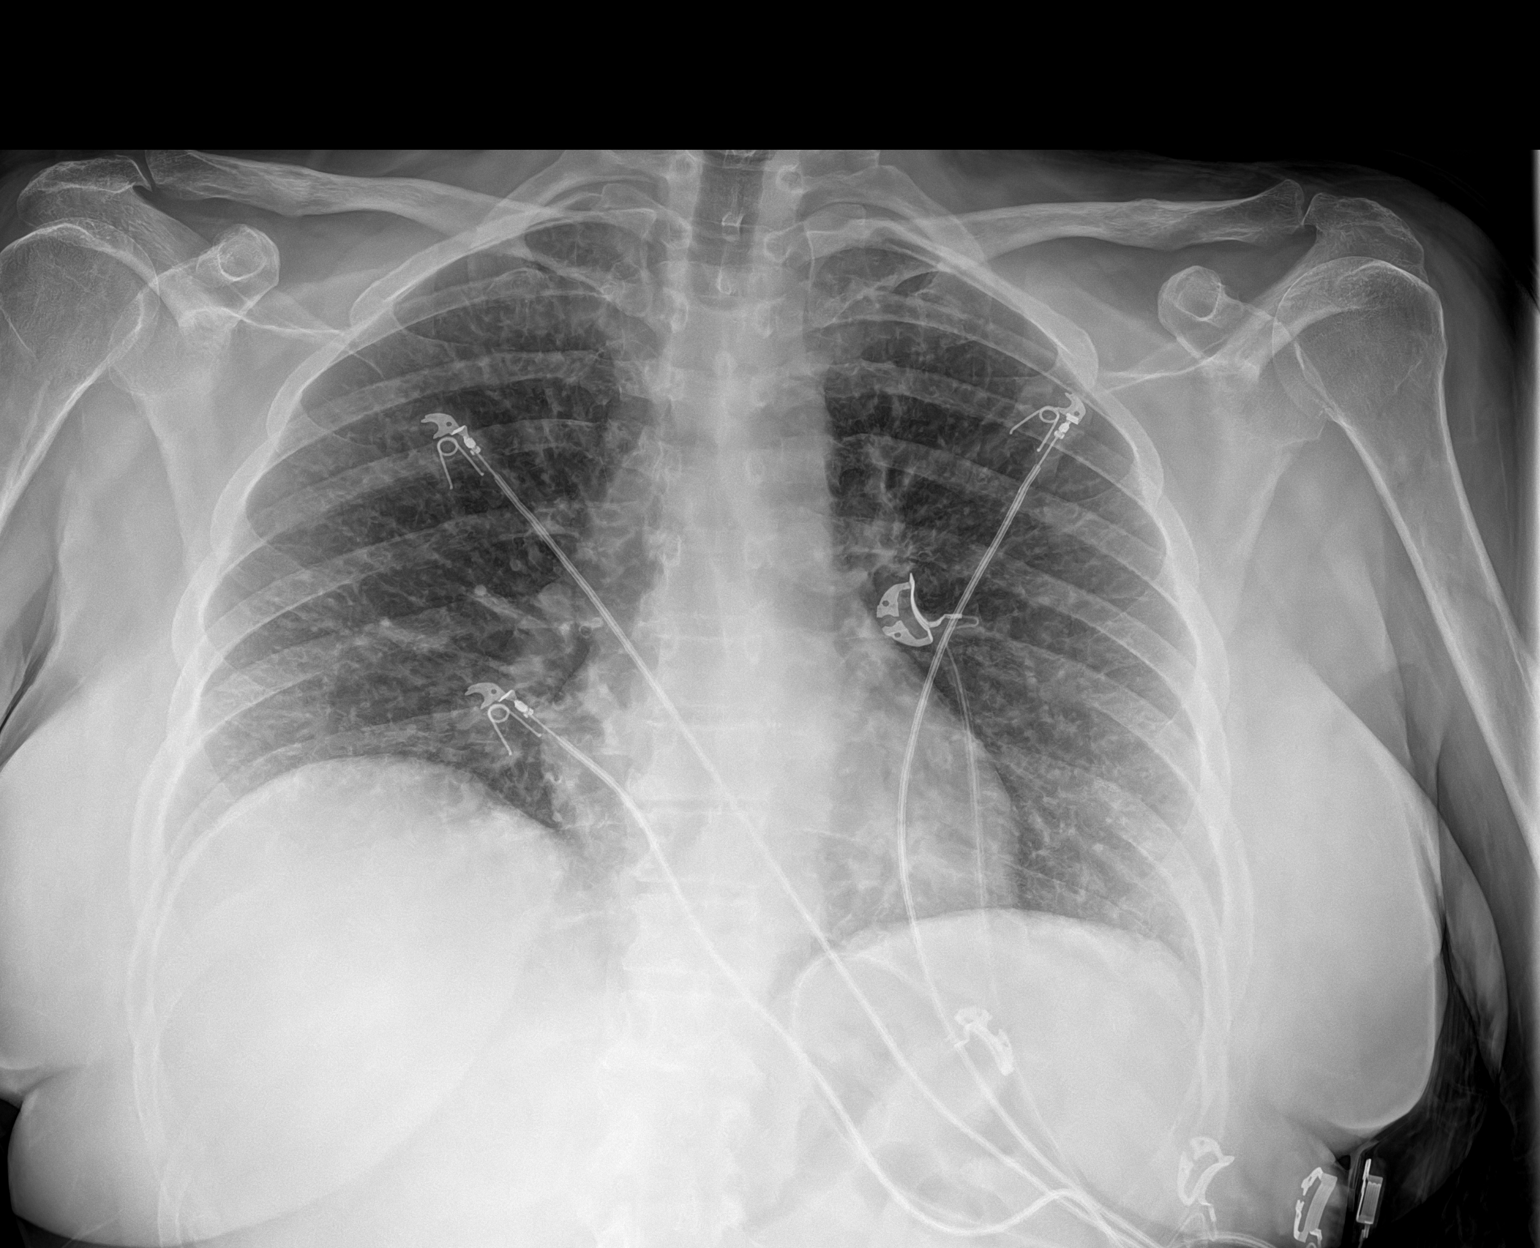

[1 of 1 positions shown; findings below may reference images not displayed]

FINDINGS: Mild diffusely increased lung markings are seen without evidence of
acute infiltrate, pleural effusion or pneumothorax. The heart size
and mediastinal contours are within normal limits. The visualized
skeletal structures are unremarkable.
IMPRESSION: Mildly increased lung markings without evidence of acute or active
cardiopulmonary disease.

## 2019-05-20 MED ORDER — POTASSIUM CHLORIDE IN NACL 40-0.9 MEQ/L-% IV SOLN
INTRAVENOUS | Status: DC
  Administered 2019-05-21: 125 mL/h via INTRAVENOUS
  Filled 2019-05-20 (×4): qty 1000

## 2019-05-20 MED ORDER — IBUPROFEN 800 MG PO TABS
800.0000 mg | ORAL_TABLET | Freq: Once | ORAL | Status: AC
  Administered 2019-05-20: 800 mg via ORAL
  Filled 2019-05-20: qty 1

## 2019-05-20 MED ORDER — SODIUM CHLORIDE 0.9 % IV SOLN
2.0000 g | Freq: Two times a day (BID) | INTRAVENOUS | Status: DC
  Administered 2019-05-21 – 2019-05-22 (×3): 2 g via INTRAVENOUS
  Filled 2019-05-20 (×3): qty 2

## 2019-05-20 MED ORDER — METRONIDAZOLE IN NACL 5-0.79 MG/ML-% IV SOLN
500.0000 mg | Freq: Once | INTRAVENOUS | Status: AC
  Administered 2019-05-20: 500 mg via INTRAVENOUS
  Filled 2019-05-20: qty 100

## 2019-05-20 MED ORDER — VANCOMYCIN HCL IN DEXTROSE 1-5 GM/200ML-% IV SOLN
1000.0000 mg | Freq: Once | INTRAVENOUS | Status: AC
  Administered 2019-05-20: 1000 mg via INTRAVENOUS
  Filled 2019-05-20: qty 200

## 2019-05-20 MED ORDER — VANCOMYCIN HCL 1250 MG/250ML IV SOLN
1250.0000 mg | INTRAVENOUS | Status: DC
  Administered 2019-05-21: 1250 mg via INTRAVENOUS
  Filled 2019-05-20: qty 250

## 2019-05-20 MED ORDER — ENOXAPARIN SODIUM 40 MG/0.4ML ~~LOC~~ SOLN
40.0000 mg | SUBCUTANEOUS | Status: DC
  Administered 2019-05-20 – 2019-05-23 (×4): 40 mg via SUBCUTANEOUS
  Filled 2019-05-20 (×4): qty 0.4

## 2019-05-20 MED ORDER — ACETAMINOPHEN 325 MG PO TABS
650.0000 mg | ORAL_TABLET | Freq: Four times a day (QID) | ORAL | Status: DC | PRN
  Administered 2019-05-21 – 2019-05-23 (×4): 650 mg via ORAL
  Filled 2019-05-20 (×4): qty 2

## 2019-05-20 MED ORDER — PROCHLORPERAZINE EDISYLATE 10 MG/2ML IJ SOLN: 5.0000 mg | INTRAMUSCULAR | Status: DC | PRN

## 2019-05-20 MED ORDER — SODIUM CHLORIDE 0.9 % IV SOLN
2.0000 g | Freq: Once | INTRAVENOUS | Status: AC
  Administered 2019-05-20: 2 g via INTRAVENOUS
  Filled 2019-05-20: qty 2

## 2019-05-20 MED ORDER — MAGNESIUM SULFATE 2 GM/50ML IV SOLN
2.0000 g | Freq: Once | INTRAVENOUS | Status: AC
  Administered 2019-05-21: 2 g via INTRAVENOUS
  Filled 2019-05-20: qty 50

## 2019-05-20 MED ORDER — SODIUM CHLORIDE 0.9 % IV BOLUS
1000.0000 mL | Freq: Once | INTRAVENOUS | Status: AC
  Administered 2019-05-20: 1000 mL via INTRAVENOUS

## 2019-05-20 MED ORDER — LACTATED RINGERS IV BOLUS (SEPSIS)
1000.0000 mL | Freq: Once | INTRAVENOUS | Status: AC
  Administered 2019-05-20: 1000 mL via INTRAVENOUS

## 2019-05-20 MED ORDER — ACETAMINOPHEN 325 MG PO TABS
650.0000 mg | ORAL_TABLET | Freq: Once | ORAL | Status: AC
  Administered 2019-05-20: 650 mg via ORAL
  Filled 2019-05-20: qty 2

## 2019-05-20 MED ORDER — ACETAMINOPHEN 650 MG RE SUPP: 650.0000 mg | Freq: Four times a day (QID) | RECTAL | Status: DC | PRN

## 2019-05-20 MED ORDER — POTASSIUM CHLORIDE 10 MEQ/100ML IV SOLN
10.0000 meq | INTRAVENOUS | Status: AC
  Administered 2019-05-20 – 2019-05-21 (×2): 10 meq via INTRAVENOUS
  Filled 2019-05-20 (×2): qty 100

## 2019-05-20 NOTE — ED Provider Notes (Signed)
[facility] EMERGENCY DEPARTMENT Provider Note   CSN: [id number] Arrival date & time: 05/20/19  1716     History Chief Complaint  Patient presents with  . Generalized Body Aches    Jeanette Suarez is a 64 y.o. female BIB her husband for shaking chills.  Past medical history of anemia, anxiety, depression, headache, hiatal hernia, hypertension and Sjogren's syndrome .history is given by the patient and her husband. The last night the husband reports that his wife was shivering and shaking so hard it woke him up.  He went around the side of the bed to try to wake her up and found her covered in urine and sweat.  She was confused and kept mumbling and would not answer questions appropriately.  He states that she did not appear to be having a seizure.  He kept asking to take her to the hospital and she kept replying no.  This morning the patient awoke and was oriented however she has had diffuse body aches, chills, malaise.  She denies cough, urinary symptoms or confusion.  She is complaining of significant left hip pain and pain with walking which is new since last night.  HPI     Past Medical History:  Diagnosis Date  . Anemia   . Anxiety   . Depression   . Family hx of colon cancer   . GERD (gastroesophageal reflux disease)   . Headache   . History of hiatal hernia   . Hypertension   . Sjogren's disease [facility])     Patient Active Problem List   Diagnosis Date Noted  . Melena 06/17/2017  . Family hx of colon cancer   . Sjogren's disease [facility])     Past Surgical History:  Procedure Laterality Date  . ABDOMINAL HYSTERECTOMY    . BIOPSY  07/22/2017   Procedure: BIOPSY;  Surgeon: Malissa Hippo, MD;  Location: [facility] ENDO SUITE;  Service: Endoscopy;;  gastric polyp  . BUNIONECTOMY Bilateral 1992  . COLONOSCOPY N/A 07/22/2017   Procedure: COLONOSCOPY;  Surgeon: Malissa Hippo, MD;  Location: [facility] ENDO SUITE;  Service: Endoscopy;  Laterality: N/A;  . ESOPHAGOGASTRODUODENOSCOPY N/A  07/22/2017   Procedure: ESOPHAGOGASTRODUODENOSCOPY (EGD);  Surgeon: Malissa Hippo, MD;  Location: [facility] ENDO SUITE;  Service: Endoscopy;  Laterality: N/A;  7:30  . eye Bilateral    lasix     OB History   No obstetric history on file.     No family history on file.  Social History   Tobacco Use  . Smoking status: Never Smoker  . Smokeless tobacco: Never Used  Substance Use Topics  . Alcohol use: No  . Drug use: No    Home Medications Prior to Admission medications   Medication Sig Start Date End Date Taking? Authorizing Provider  ALPRAZolam Prudy Feeler) 0.5 MG tablet Take 0.25 mg by mouth 2 (two) times daily as needed.     [provider]  amLODipine (NORVASC) 5 MG tablet Take 1 tablet by mouth daily. 04/30/17   [provider]  buPROPion (WELLBUTRIN XL) 150 MG 24 hr tablet Take 150 mg by mouth daily.    [provider]  cevimeline (EVOXAC) 30 MG capsule Take 30 mg by mouth 4 (four) times daily.    [provider]  cycloSPORINE (RESTASIS) 0.05 % ophthalmic emulsion Place 1 drop into both eyes every morning.    [provider]  dexlansoprazole (DEXILANT) 60 MG capsule Take 60 mg by mouth daily.    [provider]  Estradiol (ELESTRIN) 0.52 MG/0.87 GM (0.06%) GEL Place 2 Pump onto the skin daily.     [provider]  Hypromellose (NATURAL BALANCE TEARS) 0.4 % SOLN Place 1 drop into both eyes 2 (two) times daily as needed (dry eyes).    [provider]  ranitidine (ZANTAC) 150 MG tablet Take 150 mg by mouth at bedtime.    [provider]  sertraline (ZOLOFT) 100 MG tablet Take 200 mg by mouth daily.     [provider]    Allergies    Ace inhibitors and Sulfa antibiotics  Review of Systems   Review of Systems Ten systems reviewed and are negative for acute change, except as noted in the HPI.   Physical Exam Updated Vital Signs BP (!) 131/56 (BP Location: Right Arm)   Pulse (!) 114   Temp  99.6 F (37.6 C) (Oral)   Resp 18   Ht 5\' 4"  (1.626 m)   Wt 65.8 kg   SpO2 97%   BMI 24.89 kg/m   Physical Exam Vitals and nursing note reviewed.  Constitutional:      General: She is not in acute distress.    Appearance: She is well-developed. She is not diaphoretic.  HENT:     Head: Normocephalic and atraumatic.  Eyes:     General: No scleral icterus.    Conjunctiva/sclera: Conjunctivae normal.  Cardiovascular:     Rate and Rhythm: Regular rhythm. Tachycardia present.     Heart sounds: Normal heart sounds. No murmur. No friction rub. No gallop.   Pulmonary:     Effort: Pulmonary effort is normal. No respiratory distress.     Breath sounds: Normal breath sounds.  Abdominal:     General: Bowel sounds are normal. There is no distension.     Palpations: Abdomen is soft. There is no mass.     Tenderness: There is no abdominal tenderness. There is no right CVA tenderness, left CVA tenderness or guarding.  Musculoskeletal:     Cervical back: Normal range of motion.  Skin:    General: Skin is warm and dry.  Neurological:     General: No focal deficit present.     Mental Status: She is alert and oriented to person, place, and time.  Psychiatric:        Behavior: Behavior normal.     ED Results / Procedures / Treatments   Labs (all labs ordered are listed, but only abnormal results are displayed) Labs Reviewed  COMPREHENSIVE METABOLIC PANEL - Abnormal; Notable for the following components:      Result Value   Potassium 3.1 (*)    Glucose, Bld 112 (*)    Creatinine, Ser 1.12 (*)    Calcium 8.7 (*)    GFR calc non Af Amer 53 (*)    All other components within normal limits  CBC WITH DIFFERENTIAL/PLATELET - Abnormal; Notable for the following components:   WBC 23.6 (*)    MCH 25.5 (*)    Neutro Abs 21.1 (*)    Monocytes Absolute 1.4 (*)    Abs Immature Granulocytes 0.15 (*)    All other components within normal limits  LACTIC ACID, PLASMA  LACTIC ACID, PLASMA    URINALYSIS, ROUTINE W REFLEX MICROSCOPIC    EKG None  Radiology No results found.  Procedures .Critical Care Performed by: Margarita Mail, PA-C Authorized by: Margarita Mail, PA-C   Critical care provider statement:    Critical care time (minutes):  60   Critical  care time was exclusive of:  Separately billable procedures and treating other patients   Critical care was necessary to treat or prevent imminent or life-threatening deterioration of the following conditions:  Sepsis   Critical care was time spent personally by me on the following activities:  Discussions with consultants, evaluation of patient's response to treatment, examination of patient, ordering and performing treatments and interventions, ordering and review of laboratory studies, ordering and review of radiographic studies, pulse oximetry, re-evaluation of patient's condition, obtaining history from patient or surrogate and review of old charts   (including critical care time)  Medications Ordered in ED Medications - No data to display  ED Course  I have reviewed the triage vital signs and the nursing notes.  Pertinent labs & imaging results that were available during my care of the patient were reviewed by me and considered in my medical decision making (see chart for details).  Clinical Course as of May 19 2148  Wed May 20, 2019  1929 WBC(!): 23.6 [AH]  1929 Pulse Rate(!): 114 [AH]  1932 WBC(!): 23.6 [AH]    Clinical Course User Index [AH] Arthor Captain, PA-C   MDM Rules/Calculators/A&P                     This patient complains of chills and bodyaches, this involves an extensive number of treatment options, and is a complaint that carries with it a high risk of complications and morbidity.  The differential diagnosis includes infection, inflammatory, or autoimmune disorder.  I Ordered, reviewed, and interpreted labs, which included urinalysis which is positive for urinary tract infection, CBC  shows elevated white blood cell count of 23,000 with left shift.  CMP with mildly low potassium level, slightly elevated blood glucose and elevated creatinine.  Lactic acid within normal limits.  PT/INR and APTT also within normal limits, Covid test is negative.  I ordered medication fluid resuscitation at 30 mL/kg, broad-spectrum antibiotics including vancomycin, cefepime and Flagyl, Motrin  for presumed sepsis and fever I ordered imaging studies which included left hip x-ray and portable 1 view chest x-ray and I independently visualized and interpreted imaging which showed no acute abnormalities Additional history obtained from patient's husband Previous records obtained and reviewed  I consulted Dr. Robb Matar and discussed lab and imaging findings  Critical interventions: Treatment for sepsis as stated above  After the interventions stated above, I reevaluated the patient and found patient fever had been slowly climbing.  She has a heart rate of 160 after fluids however I believe this is due to the fact that she has such severe rigors.  Hopefully these will resolve with antipyretic medication.  Patient with sepsis due to urinary tract infection.  She will be admitted by Dr. Robb Matar.  She has received early interventions including full fluid resuscitation and broad-spectrum antibiotics.  Initial lactic acid is negative and she has been otherwise hemodynamically stable.  I suspect her elevated heart rate is secondary to her fever and rigors and should improve with antipyretic medication.  She is stable here in the emergency department.  Final Clinical Impression(s) / ED Diagnoses Final diagnoses:  None    Rx / DC Orders ED Discharge Orders    None       Arthor Captain, PA-C 05/20/19 2158    Bethann Berkshire, MD 05/20/19 2310

## 2019-05-20 NOTE — Progress Notes (Signed)
Pharmacy Antibiotic Note  Jeanette Suarez is a 64 y.o. female admitted on 05/20/2019 with chills and body aches.  Pharmacy has been consulted for vanc/cefepime dosing.  Patient presented with chills and fevers. Vanc and cefepime have been ordered for unknown source sepsis. She has leukocytosis. Labs have been ordered.  Scr 1.12 LA wnl Goal vanc trough 10-15  Plan: Vanc 1.25g IV q24 Cefepime 2g IV q12 Level as needed MRSA PCR  Height: 5\' 4"  (162.6 cm) Weight: 65.8 kg (145 lb) IBW/kg (Calculated) : 54.7  Temp (24hrs), Avg:99.6 F (37.6 C), Min:99.6 F (37.6 C), Max:99.6 F (37.6 C)  Recent Labs  Lab 05/20/19 1753  WBC 23.6*  CREATININE 1.12*  LATICACIDVEN 0.9    Estimated Creatinine Clearance: 49.2 mL/min (A) (by C-G formula based on SCr of 1.12 mg/dL (H)).    Allergies  Allergen Reactions  . Ace Inhibitors Cough  . Sulfa Antibiotics Swelling, Rash and Cough    Swelling to hands and lips.    Antimicrobials this admission: 5/12 vanc>> 5/12 cefepime>>  Dose adjustments this admission:   Microbiology results: 5/12 blood>> 5/12 urine>> 5/12 MRSA>>  7/12, PharmD, [place], AAHIVP, CPP Infectious Disease Pharmacist 05/20/2019 8:06 PM

## 2019-05-20 NOTE — H&P (Signed)
History and Physical    Jeanette Suarez ZDG:[id number] DOB: 1955/10/26 DOA: 05/20/2019  PCP: [name], MD   Patient coming from: Home.  I have personally briefly reviewed patient's old medical records in [place]  Chief Complaint: Fever, chills and body aches.  HPI: Jeanette Suarez is a 64 y.o. female with medical history significant of anemia, anxiety, depression, GERD, hiatal hernia, history of headaches, hypertension, Sjogren Syndrome who is coming to the emergency department after her husband reports that he woke up last week as he was shivering and shaking to such appointment, that woke him up.  He noticed that she was covered in urine and sweat.  She was also confused.  She was unable to answer questions properly time.  He tried to convince her to come to the hospital, but she declined to come to the ED.  She woke up this morning oriented, but had body aches, chills, malaise, nausea and no appetite.  She also had a frontal headache.  No rhinorrhea, sore throat, wheezing, dyspnea or hemoptysis.  No chest pain, palpitations, diaphoresis, PND, orthopnea or pitting edema of the lower extremities.  She gets occasional postural dizziness.  She fell in the bathtub several months ago.  No emesis, diarrhea, constipation, melena or hematochezia.  No polyuria, polydipsia, polyphagia or blurred vision.  ED Course: Initial vital signs were temperature 99.6 F, pulse 114, Respirations 18, blood pressure 131/56 mmHg and O2 sat 97% on room air.  The patient's temperature subsequently increased to 103.2 F while she was seen in the ER.  She had at 2007 mL LR bolus.  She also received IV cefepime, vancomycin and metronidazole after blood cultures x2 were drawn.  I added magnesium and potassium supplementation.  Urinalysis E appearance, with small hemoglobinuria, ketonuria 5 and proteinuria 100 mg/dL.  Nitrites were positive, leukocyte esterase was moderate.  RBC was 0-5 WBC more than 50 per hpf and  bacteria many. CBC showed a white count of 23.6 with 89% neutrophils, hemoglobin 12.6 g/dL and platelets 280. PT was 13.2, INR 1.0 and APTT 32. Lactic acid was 0.9 mmol/L twice.  CMP shows a potassium of 3.1 mmol/glucose was 112, creatinine 1.12 and calcium 8.7 mg/dL.  The rest of the measurements of the CMP were within expected values.  Imaging: Her chest radiograph showed mild increased lung markings without evidence of acute or active cardiopulmonary disease.  Left hip x-rays show moderate bilateral hip arthrosis, but no acute osseous abnormality.  Review of Systems: As per HPI otherwise all other systems reviewed and are negative.  Past Medical History:  Diagnosis Date  . Anemia   . Anxiety   . Depression   . Family hx of colon cancer   . GERD (gastroesophageal reflux disease)   . Headache   . History of hiatal hernia   . Hypertension   . Sjogren's disease [facility])     Past Surgical History:  Procedure Laterality Date  . ABDOMINAL HYSTERECTOMY    . BIOPSY  07/22/2017   Procedure: BIOPSY;  Surgeon: Rogene Houston, MD;  [facility];  Service: Endoscopy;;  gastric polyp  . BUNIONECTOMY Bilateral 1992  . COLONOSCOPY N/A 07/22/2017   Procedure: COLONOSCOPY;  Surgeon: Rogene Houston, MD;  Location[facility];  Service: Endoscopy;  Laterality: N/A;  . ESOPHAGOGASTRODUODENOSCOPY N/A 07/22/2017   Procedure: ESOPHAGOGASTRODUODENOSCOPY (EGD);  Surgeon: Rogene Houston, MD;  Location: [facility];  Service: Endoscopy;  Laterality: N/A;  7:30  . eye  Bilateral    lasix    Social History  reports that she has never smoked. She has never used smokeless tobacco. She reports that she does not drink alcohol or use drugs.  Allergies  Allergen Reactions  . Ace Inhibitors Cough  . Sulfa Antibiotics Swelling, Rash and Cough    Swelling to hands and lips.   Family medical history  Mother hypertension. Sister had colon cancer.  Prior to Admission medications   Medication  Sig Start Date End Date Taking? Authorizing Provider  ALPRAZolam Prudy Feeler) 0.5 MG tablet Take 0.25 mg by mouth 2 (two) times daily as needed.     [provider]  amLODipine (NORVASC) 5 MG tablet Take 1 tablet by mouth daily. 04/30/17   [provider]  buPROPion (WELLBUTRIN XL) 150 MG 24 hr tablet Take 150 mg by mouth daily.    [provider]  cevimeline (EVOXAC) 30 MG capsule Take 30 mg by mouth 4 (four) times daily.    [provider]  cycloSPORINE (RESTASIS) 0.05 % ophthalmic emulsion Place 1 drop into both eyes every morning.    [provider]  dexlansoprazole (DEXILANT) 60 MG capsule Take 60 mg by mouth daily.    [provider]  Estradiol (ELESTRIN) 0.52 MG/0.87 GM (0.06%) GEL Place 2 Pump onto the skin daily.     [provider]  Hypromellose (NATURAL BALANCE TEARS) 0.4 % SOLN Place 1 drop into both eyes 2 (two) times daily as needed (dry eyes).    [provider]  ranitidine (ZANTAC) 150 MG tablet Take 150 mg by mouth at bedtime.    [provider]  sertraline (ZOLOFT) 100 MG tablet Take 200 mg by mouth daily.     [provider]    Physical Exam: Vitals:   05/20/19 2130 05/20/19 2143 05/20/19 2145 05/20/19 2209  BP:    (!) 163/82  Pulse:      Resp: (!) 28  (!) 24 (!) 50  Temp:  (!) 103.2 F (39.6 C)    TempSrc:  Rectal    SpO2:      Weight:      Height:        Constitutional: Febrile.  Looks acutely ill. Eyes: PERRL, lids and conjunctivae mildly injected. ENMT: Mucous membranes are dry. Posterior pharynx clear of any exudate or lesions. Neck: normal, supple, no masses, no thyromegaly Respiratory: Mildly tachypneic in the mid 20s, but otherwise clear to auscultation bilaterally, no wheezing, no crackles.  No accessory muscle use.  Cardiovascular: Tachycardic in the low 100s, no murmurs / rubs / gallops. No extremity edema. 2+ pedal pulses. No carotid bruits.  Abdomen: Nondistended.  BS  positive.  Soft, positive suprapubic and epigastric tenderness, no guarding or rebound, no masses palpated. No hepatosplenomegaly. Bowel sounds positive.  Musculoskeletal: no clubbing / cyanosis. Good ROM, no contractures. Normal muscle tone.  Skin: no obvious, clinically significant rashes, lesions, ulcers on very limited dermatological examination. Neurologic: CN 2-12 grossly intact. Sensation intact, DTR normal. Strength 5/5 in all 4.  Psychiatric: Alert and oriented x 2, partially oriented to time and situation. Normal mood.   Labs on Admission: I have personally reviewed following labs and imaging studies  CBC: Recent Labs  Lab 05/20/19 1753  WBC 23.6*  NEUTROABS 21.1*  HGB 12.6  HCT 40.4  MCV 81.6  PLT 280    Basic Metabolic Panel: Recent Labs  Lab 05/20/19 1753 05/20/19 2002  NA 137  --   K 3.1*  --  CL 102  --   CO2 26  --   GLUCOSE 112*  --   BUN 15  --   CREATININE 1.12*  --   CALCIUM 8.7*  --   MG  --  1.9  PHOS  --  2.8    GFR: Estimated Creatinine Clearance: 49.2 mL/min (A) (by C-G formula based on SCr of 1.12 mg/dL (H)).  Liver Function Tests: Recent Labs  Lab 05/20/19 1753  AST 35  ALT 19  ALKPHOS 84  BILITOT 0.8  PROT 7.7  ALBUMIN 3.9    Urine analysis:    Component Value Date/Time   COLORURINE YELLOW 05/20/2019 2111   APPEARANCEUR HAZY (A) 05/20/2019 2111   LABSPEC 1.010 05/20/2019 2111   PHURINE 6.0 05/20/2019 2111   GLUCOSEU NEGATIVE 05/20/2019 2111   HGBUR SMALL (A) 05/20/2019 2111   BILIRUBINUR NEGATIVE 05/20/2019 2111   KETONESUR 5 (A) 05/20/2019 2111   PROTEINUR 100 (A) 05/20/2019 2111   NITRITE POSITIVE (A) 05/20/2019 2111   LEUKOCYTESUR MODERATE (A) 05/20/2019 2111    Radiological Exams on Admission: DG Chest Port 1 View  Result Date: 05/20/2019 CLINICAL DATA:  Generalized body aches and chills. EXAM: PORTABLE CHEST 1 VIEW COMPARISON:  None. FINDINGS: Mild diffusely increased lung markings are seen without evidence of  acute infiltrate, pleural effusion or pneumothorax. The heart size and mediastinal contours are within normal limits. The visualized skeletal structures are unremarkable. IMPRESSION: Mildly increased lung markings without evidence of acute or active cardiopulmonary disease. Electronically Signed   By: Aram Candela M.D.   On: 05/20/2019 21:27   DG HIP UNILAT WITH PELVIS 2-3 VIEWS LEFT  Result Date: 05/20/2019 CLINICAL DATA:  Left hip pain with urinary frequency, history of hysterectomy and colon cancer EXAM: DG HIP (WITH OR WITHOUT PELVIS) 2-3V LEFT COMPARISON:  None. FINDINGS: No acute bony abnormality. Specifically, no fracture, subluxation, or dislocation. Moderate bilateral hip arthrosis is present with corticated fragments along the superior acetabular margins which could reflect degenerative os acetabuli. No suspicious osseous lesions are clearly evident. Additional degenerative changes noted in the lower lumbar spine SI joints and symphysis pubis. Incomplete fusion of the posterior arch L5 is likely on the spectrum of spina bifida occulta though likely a benign incidental finding in a patient of this age. Atherosclerotic calcifications seen in the pelvis. IMPRESSION: 1. No acute osseous abnormality. 2. 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 3. Incomplete fusion posterior arch L5, congenital variant. Electronically Signed   By: Kreg Shropshire M.D.   On: 05/20/2019 21:28    EKG: Independently reviewed. Vent. rate 114 BPM PR interval * ms QRS duration 86 ms QT/QTc 324/447 ms P-R-T axes 75 40 30 Sinus tachycardia  Assessment/Plan Principal Problem:   Sepsis secondary to UTI present on admission [facility]) Admit to telemetry/inpatient. Continue IV fluids. Continue on cefepime per pharmacy. Continue vancomycin per pharmacy. Follow-up blood culture and sensitivity. Follow-up urine culture and sensitivity  Active Problems:   Hypokalemia Replacing. Magnesium has been  supplemented. Follow-up potassium level. Replace as needed.    Hypertension Currently with soft blood pressures Hold antihypertensives. Monitor blood pressure.    GERD (gastroesophageal reflux disease) Continue PPI. Continue ranitidine or formulary equivalent.    Anxiety with depression Continue Wellbutrin and alprazolam.    Memory changes Had recent brain MRI with atrophy and chronic microvascular changes. Check TSH and vitamin B12 level. Follow-up with PCP once back baseline.    DVT prophylaxis: Lovenox SQ. Code Status:   Full code. Family Communication:  Disposition Plan:   Patient is from:  Home.  Anticipated DC to:  Home.   Anticipated DC date:  05/23/2019  Anticipated DC barriers: Clinical improvement.  Consults called:   Admission status:  Inpatient/telemetry.   Severity of Illness: Medium to high severity.  Bobette Mo MD [facility]  How to contact the [facility] Attending or Consulting provider 7A - 7P or covering provider during after hours 7P -7A, for this patient?   1. Check the care team in [facility] and look for a) attending/consulting [facility] provider listed and b) the [facility] team listed 2. Log into [email or web address] and use [place] universal password to access. If you do not have the password, please contact the hospital operator. 3. Locate the [facility] provider you are looking for under [facility] and page to a number that you can be directly reached. 4. If you still have difficulty reaching the provider, please page the [facility] (Director on Call) for the Hospitalists listed on amion for assistance.  05/20/2019, 10:40 PM   This document was prepared using Dragon voice recognition software and may contain some unintended transcription errors.

## 2019-05-20 NOTE — ED Triage Notes (Signed)
Chills all last night and body aches today

## 2019-05-21 ENCOUNTER — Encounter (HOSPITAL_COMMUNITY): Payer: Self-pay | Admitting: Internal Medicine

## 2019-05-21 DIAGNOSIS — R413 Other amnesia: Secondary | ICD-10-CM

## 2019-05-21 LAB — CBC WITH DIFFERENTIAL/PLATELET
Abs Immature Granulocytes: 0.45 10*3/uL — ABNORMAL HIGH (ref 0.00–0.07)
Basophils Absolute: 0.1 10*3/uL (ref 0.0–0.1)
Basophils Relative: 0 %
Eosinophils Absolute: 0.9 10*3/uL — ABNORMAL HIGH (ref 0.0–0.5)
Eosinophils Relative: 3 %
HCT: 35.3 % — ABNORMAL LOW (ref 36.0–46.0)
Hemoglobin: 11.2 g/dL — ABNORMAL LOW (ref 12.0–15.0)
Immature Granulocytes: 2 %
Lymphocytes Relative: 2 %
Lymphs Abs: 0.6 10*3/uL — ABNORMAL LOW (ref 0.7–4.0)
MCH: 26.1 pg (ref 26.0–34.0)
MCHC: 31.7 g/dL (ref 30.0–36.0)
MCV: 82.3 fL (ref 80.0–100.0)
Monocytes Absolute: 1.4 10*3/uL — ABNORMAL HIGH (ref 0.1–1.0)
Monocytes Relative: 5 %
Neutro Abs: 25.9 10*3/uL — ABNORMAL HIGH (ref 1.7–7.7)
Neutrophils Relative %: 88 %
Platelets: 226 10*3/uL (ref 150–400)
RBC: 4.29 MIL/uL (ref 3.87–5.11)
RDW: 14.9 % (ref 11.5–15.5)
WBC Morphology: INCREASED
WBC: 29.3 10*3/uL — ABNORMAL HIGH (ref 4.0–10.5)
nRBC: 0 % (ref 0.0–0.2)

## 2019-05-21 LAB — RENAL FUNCTION PANEL
Albumin: 3.1 g/dL — ABNORMAL LOW (ref 3.5–5.0)
Anion gap: 7 (ref 5–15)
BUN: 15 mg/dL (ref 8–23)
CO2: 21 mmol/L — ABNORMAL LOW (ref 22–32)
Calcium: 7.9 mg/dL — ABNORMAL LOW (ref 8.9–10.3)
Chloride: 112 mmol/L — ABNORMAL HIGH (ref 98–111)
Creatinine, Ser: 1.08 mg/dL — ABNORMAL HIGH (ref 0.44–1.00)
GFR calc Af Amer: >60 mL/min (ref >60)
GFR calc non Af Amer: 55 mL/min — ABNORMAL LOW (ref >60)
Glucose, Bld: 130 mg/dL — ABNORMAL HIGH (ref 70–99)
Phosphorus: 1.6 mg/dL — ABNORMAL LOW (ref 2.5–4.6)
Potassium: 3.5 mmol/L (ref 3.5–5.1)
Sodium: 140 mmol/L (ref 135–145)

## 2019-05-21 LAB — BLOOD CULTURE ID PANEL (REFLEXED)

## 2019-05-21 LAB — TSH: TSH: 0.738 u[IU]/mL (ref 0.350–4.500)

## 2019-05-21 LAB — CBG MONITORING, ED: Glucose-Capillary: 100 mg/dL — ABNORMAL HIGH (ref 70–99)

## 2019-05-21 LAB — VITAMIN B12: Vitamin B-12: 414 pg/mL (ref 180–914)

## 2019-05-21 LAB — HIV ANTIBODY (ROUTINE TESTING W REFLEX): HIV Screen 4th Generation wRfx: NONREACTIVE

## 2019-05-21 MED ORDER — POTASSIUM CHLORIDE CRYS ER 20 MEQ PO TBCR
40.0000 meq | EXTENDED_RELEASE_TABLET | Freq: Once | ORAL | Status: AC
  Administered 2019-05-21: 40 meq via ORAL
  Filled 2019-05-21: qty 2

## 2019-05-21 MED ORDER — IPRATROPIUM-ALBUTEROL 0.5-2.5 (3) MG/3ML IN SOLN: 3.0000 mL | RESPIRATORY_TRACT | Status: DC | PRN

## 2019-05-21 MED ORDER — POTASSIUM CHLORIDE IN NACL 20-0.9 MEQ/L-% IV SOLN
INTRAVENOUS | Status: AC
  Filled 2019-05-21: qty 1000

## 2019-05-21 MED ORDER — POTASSIUM PHOSPHATES 15 MMOLE/5ML IV SOLN
20.0000 mmol | Freq: Once | INTRAVENOUS | Status: AC
  Administered 2019-05-21: 20 mmol via INTRAVENOUS
  Filled 2019-05-21: qty 6.67

## 2019-05-21 MED ORDER — SENNOSIDES-DOCUSATE SODIUM 8.6-50 MG PO TABS
2.0000 | ORAL_TABLET | Freq: Every evening | ORAL | Status: DC | PRN
  Filled 2019-05-21: qty 2

## 2019-05-21 MED ORDER — POLYETHYLENE GLYCOL 3350 17 G PO PACK: 17.0000 g | PACK | Freq: Every day | ORAL | Status: DC | PRN

## 2019-05-21 MED ORDER — SODIUM CHLORIDE 0.9 % IV SOLN: INTRAVENOUS | Status: AC

## 2019-05-21 NOTE — Plan of Care (Signed)
Patient will benefit from ongoing skilled PT services in  to continue to advance safe functional mobility, address ongoing impairments in, and minimize fall risk.

## 2019-05-21 NOTE — Progress Notes (Signed)
PROGRESS NOTE    Jeanette Suarez  IPJ:[id number] DOB: [birth year] DOA: 05/20/2019 PCP: Richardean Chimera, MD   Brief Narrative:  64 year old with history of anemia, anxiety, depression, headache, HTN, Sjogren's syndrome presented to the ER febrile, shaking, shivering.  Diagnosed with sepsis secondary to urinary tract infection.  Started on IV fluids, vancomycin, cefepime.   Assessment & Plan:   Principal Problem:   Sepsis secondary to UTI [facility]) Active Problems:   Hypertension   GERD (gastroesophageal reflux disease)   Anxiety with depression   Hypokalemia   Memory changes  Sepsis secondary to urinary tract infection, present on admission Sepsis protocol initiated.  Follow-up culture data Continue vancomycin and cefepime IV fluids, monitor urine output  Acute metabolic encephalopathy, improved Secondary to sepsis from UTI.  This is improving.  Exam is nonfocal.  No further radiologic imaging necessary at this time. Recent outpatient MRI showed chronic microvascular changes/atrophy TSH, B12 within normal limits.  Hypokalemia/hypophosphatemia Repletion ordered.  GERD PPI  Anxiety/depression Continue albuterol and alprazolam  Essential hypertension Not on any meds.  Currently soft blood pressure.  Med rec will need to be completed once updated by pharmacy.  DVT prophylaxis: Lovenox Code Status: Full code Family Communication: Husband at bedside  Status is: Inpatient  Remains inpatient appropriate because:Hemodynamically unstable   Dispo: The patient is from: Home              Anticipated d/c is to: Home              Anticipated d/c date is: 2 days              Patient currently is not medically stable to d/c.  Currently on broad-spectrum IV antibiotics due to sepsis.  Rising WBC.  Awaiting culture data.    Subjective: Feels better than yesterday.  Husband present at bedside.  No new complaints at the moment.  Denies any abdominal pain, nausea and vomiting.   Dysuria has improved  Review of Systems Otherwise negative except as per HPI, including: General: Denies fever, chills, night sweats or unintended weight loss. Resp: Denies cough, wheezing, shortness of breath. Cardiac: Denies chest pain, palpitations, orthopnea, paroxysmal nocturnal dyspnea. GI: Denies abdominal pain, nausea, vomiting, diarrhea or constipation GU: Denies hematuria MS: Denies muscle aches, joint pain or swelling Neuro: Denies headache, neurologic deficits (focal weakness, numbness, tingling), abnormal gait Psych: Denies anxiety, depression, SI/HI/AVH Skin: Denies new rashes or lesions ID: Denies sick contacts, exotic exposures, travel  Examination:  General exam: Somewhat ill-appearing but comfortable.  Dry mouth. Respiratory system: Clear to auscultation. Respiratory effort normal. Cardiovascular system: S1 & S2 heard, RRR. No JVD, murmurs, rubs, gallops or clicks. No pedal edema. Gastrointestinal system: Abdomen is nondistended, soft and nontender. No organomegaly or masses felt. Normal bowel sounds heard. Central nervous system: Alert and oriented. No focal neurological deficits. Extremities: Symmetric 5 x 5 power. Skin: No rashes, lesions or ulcers Psychiatry: Judgement and insight appear normal. Mood & affect appropriate.     Objective: Vitals:   05/21/19 0845 05/21/19 0900 05/21/19 0915 05/21/19 0930  BP:  (!) 119/59  123/78  Pulse: 90 87 99 95  Resp: (!) 21 (!) 23 17 20   Temp:      TempSrc:      SpO2: 100% 99% 99% 99%  Weight:      Height:        Intake/Output Summary (Last 24 hours) at 05/21/2019 1125 Last data filed at 05/21/2019 0852 Gross per 24 hour  Intake 1000  ml  Output --  Net 1000 ml   Filed Weights   05/20/19 1730  Weight: 65.8 kg     Data Reviewed:   CBC: Recent Labs  Lab 05/20/19 1753 05/21/19 0454  WBC 23.6* 29.3*  NEUTROABS 21.1* 25.9*  HGB 12.6 11.2*  HCT 40.4 35.3*  MCV 81.6 82.3  PLT 280 409   Basic Metabolic  Panel: Recent Labs  Lab 05/20/19 1753 05/20/19 2002 05/21/19 0454  NA 137  --  140  K 3.1*  --  3.5  CL 102  --  112*  CO2 26  --  21*  GLUCOSE 112*  --  130*  BUN 15  --  15  CREATININE 1.12*  --  1.08*  CALCIUM 8.7*  --  7.9*  MG  --  1.9  --   PHOS  --  2.8 1.6*   GFR: Estimated Creatinine Clearance: 51 mL/min (A) (by C-G formula based on SCr of 1.08 mg/dL (H)). Liver Function Tests: Recent Labs  Lab 05/20/19 1753 05/21/19 0454  AST 35  --   ALT 19  --   ALKPHOS 84  --   BILITOT 0.8  --   PROT 7.7  --   ALBUMIN 3.9 3.1*   No results for input(s): LIPASE, AMYLASE in the last 168 hours. No results for input(s): AMMONIA in the last 168 hours. Coagulation Profile: Recent Labs  Lab 05/20/19 2002  INR 1.0   Cardiac Enzymes: No results for input(s): CKTOTAL, CKMB, CKMBINDEX, TROPONINI in the last 168 hours. BNP (last 3 results) No results for input(s): PROBNP in the last 8760 hours. HbA1C: No results for input(s): HGBA1C in the last 72 hours. CBG: No results for input(s): GLUCAP in the last 168 hours. Lipid Profile: No results for input(s): CHOL, HDL, LDLCALC, TRIG, CHOLHDL, LDLDIRECT in the last 72 hours. Thyroid Function Tests: Recent Labs    05/20/19 2002  TSH 0.738   Anemia Panel: Recent Labs    05/21/19 0454  VITAMINB12 414   Sepsis Labs: Recent Labs  Lab 05/20/19 1753 05/20/19 1932  LATICACIDVEN 0.9 0.9    Recent Results (from the past 240 hour(s))  SARS Coronavirus 2 by RT PCR (hospital order, performed in [facility] hospital lab) Nasopharyngeal Nasopharyngeal Swab     Status: None   Collection Time: 05/20/19  7:31 PM   Specimen: Nasopharyngeal Swab  Result Value Ref Range Status   SARS Coronavirus 2 NEGATIVE NEGATIVE Final    Comment: (NOTE) SARS-CoV-2 target nucleic acids are NOT DETECTED. The SARS-CoV-2 RNA is generally detectable in upper and lower respiratory specimens during the acute phase of infection. The lowest concentration  of SARS-CoV-2 viral copies this assay can detect is 250 copies / mL. A negative result does not preclude SARS-CoV-2 infection and should not be used as the sole basis for treatment or other patient management decisions.  A negative result may occur with improper specimen collection / handling, submission of specimen other than nasopharyngeal swab, presence of viral mutation(s) within the areas targeted by this assay, and inadequate number of viral copies (<250 copies / mL). A negative result must be combined with clinical observations, patient history, and epidemiological information. Fact Sheet for Patients:   StrictlyIdeas.no Fact Sheet for Healthcare Providers: BankingDealers.co.za This test is not yet approved or cleared  by the [place] FDA and has been authorized for detection and/or diagnosis of SARS-CoV-2 by FDA under an Emergency Use Authorization (EUA).  This EUA will remain in effect (meaning this  test can be used) for the duration of the COVID-19 declaration under Section 564(b)(1) of the Act, 21 U.S.C. section 360bbb-3(b)(1), unless the authorization is terminated or revoked sooner. Performed at [facility], [address], [place], [place] [address]          Radiology Studies: [facility] Chest [facility] 1 View  Result Date: 05/20/2019 CLINICAL DATA:  Generalized body aches and chills. EXAM: PORTABLE CHEST 1 VIEW COMPARISON:  None. FINDINGS: Mild diffusely increased lung markings are seen without evidence of acute infiltrate, pleural effusion or pneumothorax. The heart size and mediastinal contours are within normal limits. The visualized skeletal structures are unremarkable. IMPRESSION: Mildly increased lung markings without evidence of acute or active cardiopulmonary disease. Electronically Signed   By: Aram Candela M.D.   On: 05/20/2019 21:27   DG HIP UNILAT WITH PELVIS 2-3 VIEWS LEFT  Result Date: 05/20/2019 CLINICAL DATA:   Left hip pain with urinary frequency, history of hysterectomy and colon cancer EXAM: DG HIP (WITH OR WITHOUT PELVIS) 2-3V LEFT COMPARISON:  None. FINDINGS: No acute bony abnormality. Specifically, no fracture, subluxation, or dislocation. Moderate bilateral hip arthrosis is present with corticated fragments along the superior acetabular margins which could reflect degenerative os acetabuli. No suspicious osseous lesions are clearly evident. Additional degenerative changes noted in the lower lumbar spine SI joints and symphysis pubis. Incomplete fusion of the posterior arch L5 is likely on the spectrum of spina bifida occulta though likely a benign incidental finding in a patient of this age. Atherosclerotic calcifications seen in the pelvis. IMPRESSION: 1. No acute osseous abnormality. 2. 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 3. Incomplete fusion posterior arch L5, congenital variant. Electronically Signed   By: Kreg Shropshire M.D.   On: 05/20/2019 21:28        Scheduled Meds: . enoxaparin (LOVENOX) injection  40 mg Subcutaneous Q24H   Continuous Infusions: . ceFEPime (MAXIPIME) IV Stopped (05/21/19 0629)  . potassium PHOSPHATE IVPB (in mmol) 20 mmol (05/21/19 0953)  . vancomycin       LOS: 1 day   Time spent= 35 mins    Jlynn Langille Joline Maxcy, MD [facility]  If 7PM-7AM, please contact night-coverage  05/21/2019, 11:25 AM

## 2019-05-21 NOTE — ED Notes (Signed)
Family at bedside. 

## 2019-05-21 NOTE — Progress Notes (Signed)
CRITICAL VALUE ALERT  Critical Value:  Blood culture 1 bottle anaerobic gram neg rods e coli kpc neg  Date & Time Notied:  11:24 PM 05/21/19   Provider Notified: Robb Matar  Orders Received/Actions taken: text paged

## 2019-05-21 NOTE — ED Notes (Signed)
Patient denies pain and is resting comfortably.  

## 2019-05-21 NOTE — ED Notes (Signed)
Patient given water at this time.  

## 2019-05-21 NOTE — ED Notes (Signed)
Assisted patient to bedside commode

## 2019-05-22 LAB — CBC WITH DIFFERENTIAL/PLATELET
Abs Immature Granulocytes: 0.08 10*3/uL — ABNORMAL HIGH (ref 0.00–0.07)
Basophils Absolute: 0 10*3/uL (ref 0.0–0.1)
Basophils Relative: 0 %
Eosinophils Absolute: 0 10*3/uL (ref 0.0–0.5)
Eosinophils Relative: 0 %
HCT: 36.6 % (ref 36.0–46.0)
Hemoglobin: 11.2 g/dL — ABNORMAL LOW (ref 12.0–15.0)
Immature Granulocytes: 1 %
Lymphocytes Relative: 5 %
Lymphs Abs: 0.7 10*3/uL (ref 0.7–4.0)
MCH: 25.8 pg — ABNORMAL LOW (ref 26.0–34.0)
MCHC: 30.6 g/dL (ref 30.0–36.0)
MCV: 84.3 fL (ref 80.0–100.0)
Monocytes Absolute: 0.9 10*3/uL (ref 0.1–1.0)
Monocytes Relative: 6 %
Neutro Abs: 13.8 10*3/uL — ABNORMAL HIGH (ref 1.7–7.7)
Neutrophils Relative %: 88 %
Platelets: 212 10*3/uL (ref 150–400)
RBC: 4.34 MIL/uL (ref 3.87–5.11)
RDW: 15.2 % (ref 11.5–15.5)
WBC: 15.5 10*3/uL — ABNORMAL HIGH (ref 4.0–10.5)
nRBC: 0 % (ref 0.0–0.2)

## 2019-05-22 LAB — RENAL FUNCTION PANEL
Albumin: 3 g/dL — ABNORMAL LOW (ref 3.5–5.0)
Anion gap: 11 (ref 5–15)
BUN: 9 mg/dL (ref 8–23)
CO2: 18 mmol/L — ABNORMAL LOW (ref 22–32)
Calcium: 8 mg/dL — ABNORMAL LOW (ref 8.9–10.3)
Chloride: 109 mmol/L (ref 98–111)
Creatinine, Ser: 0.85 mg/dL (ref 0.44–1.00)
GFR calc Af Amer: >60 mL/min (ref >60)
GFR calc non Af Amer: >60 mL/min (ref >60)
Glucose, Bld: 114 mg/dL — ABNORMAL HIGH (ref 70–99)
Phosphorus: 1.7 mg/dL — ABNORMAL LOW (ref 2.5–4.6)
Potassium: 3.8 mmol/L (ref 3.5–5.1)
Sodium: 138 mmol/L (ref 135–145)

## 2019-05-22 LAB — URINE CULTURE

## 2019-05-22 LAB — MAGNESIUM: Magnesium: 2.7 mg/dL — ABNORMAL HIGH (ref 1.7–2.4)

## 2019-05-22 MED ORDER — LEVALBUTEROL HCL 1.25 MG/0.5ML IN NEBU: 1.2500 mg | INHALATION_SOLUTION | Freq: Four times a day (QID) | RESPIRATORY_TRACT | Status: DC | PRN

## 2019-05-22 MED ORDER — SODIUM CHLORIDE 0.9 % IV SOLN: 2.0000 g | INTRAVENOUS | Status: DC

## 2019-05-22 MED ORDER — METOPROLOL TARTRATE 5 MG/5ML IV SOLN
2.5000 mg | Freq: Four times a day (QID) | INTRAVENOUS | Status: DC | PRN
  Administered 2019-05-22: 2.5 mg via INTRAVENOUS
  Filled 2019-05-22: qty 5

## 2019-05-22 MED ORDER — MAGNESIUM SULFATE 2 GM/50ML IV SOLN
2.0000 g | Freq: Once | INTRAVENOUS | Status: AC
  Administered 2019-05-22: 2 g via INTRAVENOUS
  Filled 2019-05-22: qty 50

## 2019-05-22 MED ORDER — SODIUM CHLORIDE 0.9 % IV SOLN: INTRAVENOUS | Status: DC

## 2019-05-22 MED ORDER — IPRATROPIUM BROMIDE 0.02 % IN SOLN: 0.5000 mg | Freq: Four times a day (QID) | RESPIRATORY_TRACT | Status: DC | PRN

## 2019-05-22 MED ORDER — SODIUM CHLORIDE 0.9 % IV SOLN
1.0000 g | Freq: Three times a day (TID) | INTRAVENOUS | Status: DC
  Administered 2019-05-22 – 2019-05-23 (×3): 1 g via INTRAVENOUS
  Filled 2019-05-22 (×3): qty 1

## 2019-05-22 MED ORDER — KETOROLAC TROMETHAMINE 15 MG/ML IJ SOLN
15.0000 mg | Freq: Once | INTRAMUSCULAR | Status: AC
  Administered 2019-05-22: 15 mg via INTRAVENOUS
  Filled 2019-05-22: qty 1

## 2019-05-22 NOTE — Plan of Care (Signed)

## 2019-05-22 NOTE — Progress Notes (Signed)
Patient had elevated temp this am 102.8. Mews score 4 in red. Notified Dr. Robb Matar and new order received and implemented. Cooling measures initiated. Upon recheck temperature trending down. Will notify oncoming shift.

## 2019-05-22 NOTE — Progress Notes (Signed)
PROGRESS NOTE    Jeanette Suarez  ZWC:[id number] DOB: [birth year] DOA: 05/20/2019 PCP: Richardean Chimera, MD   Brief Narrative:  64 year old with history of anemia, anxiety, depression, headache, HTN, Sjogren's syndrome presented to the ER febrile, shaking, shivering.  Diagnosed with sepsis secondary to urinary tract infection.  Started on IV fluids, vancomycin, cefepime.  Prelim blood cultures are growing gram-negative rods-E. coli.  Sensitivities are pending.   Assessment & Plan:   Principal Problem:   Sepsis secondary to UTI [facility]) Active Problems:   Hypertension   GERD (gastroesophageal reflux disease)   Anxiety with depression   Hypokalemia   Memory changes  Sepsis secondary to urinary tract infection, present on admission E. coli bacteremia Sepsis physiology has improved. Discontinue vancomycin and cefepime.  Transition to IV Rocephin Cultures reviewed, sensitivity data pending. Continue IV fluids.  Acute metabolic encephalopathy, resolved Secondary to sepsis from UTI.  This is improving.  Exam is nonfocal.  No further radiologic imaging necessary at this time. Recent outpatient MRI showed chronic microvascular changes/atrophy TSH, B12 within normal limits.  Hypokalemia/hypophosphatemia Repletion ordered.  GERD PPI  Anxiety/depression Continue albuterol and alprazolam  Essential hypertension Not on any meds.  Currently soft blood pressure.   DVT prophylaxis: Lovenox Code Status: Full code Family Communication: Husband over the phone while I was in the room  Status is: Inpatient  Remains inpatient appropriate because:Hemodynamically unstable   Dispo: The patient is from: Home              Anticipated d/c is to: Home              Anticipated d/c date is=1 day              Patient currently is not medically stable to d/c.   Will transition/de-escalate antibiotics to Rocephin.  Maintain hospital stay until we have culture sensitivities available given  bacteremia.  Plan to discharge her tomorrow.   Subjective: Overall feels better compared to yesterday.  Slightly lousy but greatly improved overall.  Had low-grade fever yesterday but none this morning.  Review of Systems Otherwise negative except as per HPI, including: General: Denies fever, chills, night sweats or unintended weight loss. Resp: Denies cough, wheezing, shortness of breath. Cardiac: Denies chest pain, palpitations, orthopnea, paroxysmal nocturnal dyspnea. GI: Denies abdominal pain, nausea, vomiting, diarrhea or constipation GU: Denies dysuria, frequency, hesitancy or incontinence MS: Denies muscle aches, joint pain or swelling Neuro: Denies headache, neurologic deficits (focal weakness, numbness, tingling), abnormal gait Psych: Denies anxiety, depression, SI/HI/AVH Skin: Denies new rashes or lesions ID: Denies sick contacts, exotic exposures, travel  Examination: Constitutional: Not in acute distress Respiratory: Clear to auscultation bilaterally Cardiovascular: Normal sinus rhythm, no rubs Abdomen: Nontender nondistended good bowel sounds Musculoskeletal: No edema noted Skin: No rashes seen Neurologic: CN 2-12 grossly intact.  And nonfocal Psychiatric: Normal judgment and insight. Alert and oriented x 3. Normal mood.   Objective: Vitals:   05/22/19 0600 05/22/19 0638 05/22/19 0717 05/22/19 0949  BP: (!) 169/80   (!) 137/57  Pulse: (!) 120 72 94 98  Resp: 20  20 20   Temp: (!) 102.8 F (39.3 C) (!) 100.4 F (38 C) 99.8 F (37.7 C) 99.2 F (37.3 C)  TempSrc: Oral Oral  Oral  SpO2: 97% 100% 98% 97%  Weight:      Height:        Intake/Output Summary (Last 24 hours) at 05/22/2019 1121 Last data filed at 05/22/2019 0900 Gross per 24 hour  Intake 1834.69  ml  Output --  Net 1834.69 ml   Filed Weights   05/20/19 1730  Weight: 65.8 kg     Data Reviewed:   CBC: Recent Labs  Lab 05/20/19 1753 05/21/19 0454 05/22/19 0409  WBC 23.6* 29.3* 15.5*   NEUTROABS 21.1* 25.9* 13.8*  HGB 12.6 11.2* 11.2*  HCT 40.4 35.3* 36.6  MCV 81.6 82.3 84.3  PLT 280 226 166   Basic Metabolic Panel: Recent Labs  Lab 05/20/19 1753 05/20/19 2002 05/21/19 0454 05/22/19 0409  NA 137  --  140 138  K 3.1*  --  3.5 3.8  CL 102  --  112* 109  CO2 26  --  21* 18*  GLUCOSE 112*  --  130* 114*  BUN 15  --  15 9  CREATININE 1.12*  --  1.08* 0.85  CALCIUM 8.7*  --  7.9* 8.0*  MG  --  1.9  --  2.7*  PHOS  --  2.8 1.6* 1.7*   GFR: Estimated Creatinine Clearance: 64.8 mL/min (by C-G formula based on SCr of 0.85 mg/dL). Liver Function Tests: Recent Labs  Lab 05/20/19 1753 05/21/19 0454 05/22/19 0409  AST 35  --   --   ALT 19  --   --   ALKPHOS 84  --   --   BILITOT 0.8  --   --   PROT 7.7  --   --   ALBUMIN 3.9 3.1* 3.0*   No results for input(s): LIPASE, AMYLASE in the last 168 hours. No results for input(s): AMMONIA in the last 168 hours. Coagulation Profile: Recent Labs  Lab 05/20/19 2002  INR 1.0   Cardiac Enzymes: No results for input(s): CKTOTAL, CKMB, CKMBINDEX, TROPONINI in the last 168 hours. BNP (last 3 results) No results for input(s): PROBNP in the last 8760 hours. HbA1C: No results for input(s): HGBA1C in the last 72 hours. CBG: Recent Labs  Lab 05/21/19 1141  GLUCAP 100*   Lipid Profile: No results for input(s): CHOL, HDL, LDLCALC, TRIG, CHOLHDL, LDLDIRECT in the last 72 hours. Thyroid Function Tests: Recent Labs    05/20/19 2002  TSH 0.738   Anemia Panel: Recent Labs    05/21/19 0454  VITAMINB12 414   Sepsis Labs: Recent Labs  Lab 05/20/19 1753 05/20/19 1932  LATICACIDVEN 0.9 0.9    Recent Results (from the past 240 hour(s))  SARS Coronavirus 2 by RT PCR (hospital order, performed in [facility] hospital lab) Nasopharyngeal Nasopharyngeal Swab     Status: None   Collection Time: 05/20/19  7:31 PM   Specimen: Nasopharyngeal Swab  Result Value Ref Range Status   SARS Coronavirus 2 NEGATIVE NEGATIVE  Final    Comment: (NOTE) SARS-CoV-2 target nucleic acids are NOT DETECTED. The SARS-CoV-2 RNA is generally detectable in upper and lower respiratory specimens during the acute phase of infection. The lowest concentration of SARS-CoV-2 viral copies this assay can detect is 250 copies / mL. A negative result does not preclude SARS-CoV-2 infection and should not be used as the sole basis for treatment or other patient management decisions.  A negative result may occur with improper specimen collection / handling, submission of specimen other than nasopharyngeal swab, presence of viral mutation(s) within the areas targeted by this assay, and inadequate number of viral copies (<250 copies / mL). A negative result must be combined with clinical observations, patient history, and epidemiological information. Fact Sheet for Patients:   StrictlyIdeas.no Fact Sheet for Healthcare Providers: BankingDealers.co.za This test is not  yet approved or cleared  by the [place] and has been authorized for detection and/or diagnosis of SARS-CoV-2 by FDA under an Emergency Use Authorization (EUA).  This EUA will remain in effect (meaning this test can be used) for the duration of the COVID-19 declaration under Section 564(b)(1) of the Act, 21 U.S.C. section 360bbb-3(b)(1), unless the authorization is terminated or revoked sooner. Performed at [facility], [address], [place], [place] [address]   Blood Culture (routine x 2)     Status: None (Preliminary result)   Collection Time: 05/20/19  8:01 PM   Specimen: BLOOD RIGHT HAND  Result Value Ref Range Status   Specimen Description BLOOD RIGHT HAND  Final   Special Requests   Final    BOTTLES DRAWN AEROBIC AND ANAEROBIC Blood Culture adequate volume   Culture   Final    NO GROWTH 2 DAYS Performed at [facility], [address], [place], [place] [address]    Report Status PENDING  Incomplete    Blood Culture (routine x 2)     Status: Abnormal (Preliminary result)   Collection Time: 05/20/19  8:02 PM   Specimen: BLOOD  Result Value Ref Range Status   Specimen Description   Final    BLOOD LEFT ANTECUBITAL Performed at [facility], [address], [place], [place] [address]    Special Requests   Final    BOTTLES DRAWN AEROBIC AND ANAEROBIC Blood Culture adequate volume Performed at [facility], [address], [place], [place] [address]    Culture  Setup Time   Final    GRAM NEGATIVE RODS ANAEROBIC BOTTLE ONLY Gram Stain Report Called to,Read Back By and Verified With: WRIGHT,L ON 05/21/19 AT 1545 BY LOY,C PERFORMED AT [facility] CRITICAL RESULT CALLED TO, READ BACK BY AND VERIFIED WITH: H EVANS RN 05/21/19 2211 JDW Performed at [facility], [address], [place], [place] [address]    Culture ESCHERICHIA COLI (A)  Final   Report Status PENDING  Incomplete  Blood Culture ID Panel (Reflexed)     Status: Abnormal   Collection Time: 05/20/19  8:02 PM  Result Value Ref Range Status   Enterococcus species NOT DETECTED NOT DETECTED Final   Listeria monocytogenes NOT DETECTED NOT DETECTED Final   Staphylococcus species NOT DETECTED NOT DETECTED Final   Staphylococcus aureus (BCID) NOT DETECTED NOT DETECTED Final   Streptococcus species NOT DETECTED NOT DETECTED Final   Streptococcus agalactiae NOT DETECTED NOT DETECTED Final   Streptococcus pneumoniae NOT DETECTED NOT DETECTED Final   Streptococcus pyogenes NOT DETECTED NOT DETECTED Final   Acinetobacter baumannii NOT DETECTED NOT DETECTED Final   Enterobacteriaceae species DETECTED (A) NOT DETECTED Final    Comment: Enterobacteriaceae represent a large family of gram-negative bacteria, not a single organism. CRITICAL RESULT CALLED TO, READ BACK BY AND VERIFIED WITH: H EVANS RN 05/21/19/2211 JDW    Enterobacter cloacae complex NOT DETECTED NOT DETECTED Final   Escherichia coli DETECTED (A) NOT DETECTED Final    Comment:  CRITICAL RESULT CALLED TO, READ BACK BY AND VERIFIED WITH: H EVANS RN 05/21/19 2211 JDW    Klebsiella oxytoca NOT DETECTED NOT DETECTED Final   Klebsiella pneumoniae NOT DETECTED NOT DETECTED Final   Proteus species NOT DETECTED NOT DETECTED Final   Serratia marcescens NOT DETECTED NOT DETECTED Final   Carbapenem resistance NOT DETECTED NOT DETECTED Final   Haemophilus influenzae NOT DETECTED NOT DETECTED Final   Neisseria meningitidis NOT DETECTED NOT DETECTED Final  Pseudomonas aeruginosa NOT DETECTED NOT DETECTED Final   Candida albicans NOT DETECTED NOT DETECTED Final   Candida glabrata NOT DETECTED NOT DETECTED Final   Candida krusei NOT DETECTED NOT DETECTED Final   Candida parapsilosis NOT DETECTED NOT DETECTED Final   Candida tropicalis NOT DETECTED NOT DETECTED Final    Comment: Performed at [facility], [address], [place], [place] [address]  Urine culture     Status: Abnormal   Collection Time: 05/20/19  9:11 PM   Specimen: In/Out Cath Urine  Result Value Ref Range Status   Specimen Description   Final    IN/OUT CATH URINE Performed at [facility], [address], [place], [place] [address]    Special Requests   Final    NONE Performed at [facility], [address] [place], [place] [address]    Culture MULTIPLE SPECIES PRESENT, SUGGEST RECOLLECTION (A)  Final   Report Status 05/22/2019 FINAL  Final         Radiology Studies: DG Chest Port 1 View  Result Date: 05/20/2019 CLINICAL DATA:  Generalized body aches and chills. EXAM: PORTABLE CHEST 1 VIEW COMPARISON:  None. FINDINGS: Mild diffusely increased lung markings are seen without evidence of acute infiltrate, pleural effusion or pneumothorax. The heart size and mediastinal contours are within normal limits. The visualized skeletal structures are unremarkable. IMPRESSION: Mildly increased lung markings without evidence of acute or active cardiopulmonary disease. Electronically Signed   By: Aram Candela M.D.   On: 05/20/2019 21:27   DG HIP UNILAT WITH PELVIS 2-3 VIEWS LEFT  Result Date: 05/20/2019 CLINICAL DATA:  Left hip pain with urinary frequency, history of hysterectomy and colon cancer EXAM: DG HIP (WITH OR WITHOUT PELVIS) 2-3V LEFT COMPARISON:  None. FINDINGS: No acute bony abnormality. Specifically, no fracture, subluxation, or dislocation. Moderate bilateral hip arthrosis is present with corticated fragments along the superior acetabular margins which could reflect degenerative os acetabuli. No suspicious osseous lesions are clearly evident. Additional degenerative changes noted in the lower lumbar spine SI joints and symphysis pubis. Incomplete fusion of the posterior arch L5 is likely on the spectrum of spina bifida occulta though likely a benign incidental finding in a patient of this age. Atherosclerotic calcifications seen in the pelvis. IMPRESSION: 1. No acute osseous abnormality. 2. 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 3. Incomplete fusion posterior arch L5, congenital variant. Electronically Signed   By: Kreg Shropshire M.D.   On: 05/20/2019 21:28        Scheduled Meds: . enoxaparin (LOVENOX) injection  40 mg Subcutaneous Q24H   Continuous Infusions: . sodium chloride 100 mL/hr at 05/21/19 2231  . cefTRIAXone (ROCEPHIN)  IV       LOS: 2 days   Time spent= 35 mins    Christohper Dube Joline Maxcy, MD [facility]  If 7PM-7AM, please contact night-coverage  05/22/2019, 11:21 AM

## 2019-05-22 NOTE — Progress Notes (Signed)
[facility] night shift.  The patient has been tachycardic in the low 100s and 110s since earlier.  She had a temporary period with her heart rate in the 130s and 140s.  She denied chest pain or dyspnea.  Her potassium level earlier in the morning was 3.5 mmol/L, but he was corrected with oral KCl.  SBP has been mostly in the 130s and 140s.  I will try metoprolol 2.5 mg IVP every 6 hours as needed.  I will supplement magnesium with 2 g of mag sulfate IVPB.  Sanda Klein, MD

## 2019-05-23 LAB — CULTURE, BLOOD (ROUTINE X 2): Special Requests: ADEQUATE

## 2019-05-23 LAB — CBC WITH DIFFERENTIAL/PLATELET
Abs Immature Granulocytes: 0.03 10*3/uL (ref 0.00–0.07)
Basophils Absolute: 0 10*3/uL (ref 0.0–0.1)
Basophils Relative: 0 %
Eosinophils Absolute: 0.1 10*3/uL (ref 0.0–0.5)
Eosinophils Relative: 1 %
HCT: 32.2 % — ABNORMAL LOW (ref 36.0–46.0)
Hemoglobin: 10.4 g/dL — ABNORMAL LOW (ref 12.0–15.0)
Immature Granulocytes: 0 %
Lymphocytes Relative: 10 %
Lymphs Abs: 0.7 10*3/uL (ref 0.7–4.0)
MCH: 25.9 pg — ABNORMAL LOW (ref 26.0–34.0)
MCHC: 32.3 g/dL (ref 30.0–36.0)
MCV: 80.3 fL (ref 80.0–100.0)
Monocytes Absolute: 0.8 10*3/uL (ref 0.1–1.0)
Monocytes Relative: 10 %
Neutro Abs: 6.1 10*3/uL (ref 1.7–7.7)
Neutrophils Relative %: 79 %
Platelets: 231 10*3/uL (ref 150–400)
RBC: 4.01 MIL/uL (ref 3.87–5.11)
RDW: 15.1 % (ref 11.5–15.5)
WBC: 7.7 10*3/uL (ref 4.0–10.5)
nRBC: 0 % (ref 0.0–0.2)

## 2019-05-23 LAB — RENAL FUNCTION PANEL
Albumin: 3 g/dL — ABNORMAL LOW (ref 3.5–5.0)
Anion gap: 7 (ref 5–15)
BUN: 6 mg/dL — ABNORMAL LOW (ref 8–23)
CO2: 22 mmol/L (ref 22–32)
Calcium: 8.1 mg/dL — ABNORMAL LOW (ref 8.9–10.3)
Chloride: 110 mmol/L (ref 98–111)
Creatinine, Ser: 0.8 mg/dL (ref 0.44–1.00)
GFR calc Af Amer: >60 mL/min (ref >60)
GFR calc non Af Amer: >60 mL/min (ref >60)
Glucose, Bld: 108 mg/dL — ABNORMAL HIGH (ref 70–99)
Phosphorus: 2.2 mg/dL — ABNORMAL LOW (ref 2.5–4.6)
Potassium: 3.4 mmol/L — ABNORMAL LOW (ref 3.5–5.1)
Sodium: 139 mmol/L (ref 135–145)

## 2019-05-23 LAB — GLUCOSE, CAPILLARY
Glucose-Capillary: 93 mg/dL (ref 70–99)
Glucose-Capillary: 76 mg/dL (ref 70–99)
Glucose-Capillary: 108 mg/dL — ABNORMAL HIGH (ref 70–99)
Glucose-Capillary: 79 mg/dL (ref 70–99)

## 2019-05-23 LAB — MAGNESIUM: Magnesium: 2 mg/dL (ref 1.7–2.4)

## 2019-05-23 LAB — MRSA PCR SCREENING: MRSA by PCR: NEGATIVE

## 2019-05-23 MED ORDER — POTASSIUM CHLORIDE CRYS ER 20 MEQ PO TBCR
40.0000 meq | EXTENDED_RELEASE_TABLET | Freq: Once | ORAL | Status: AC
  Administered 2019-05-23: 40 meq via ORAL
  Filled 2019-05-23: qty 2

## 2019-05-23 MED ORDER — FLUTICASONE PROPIONATE 50 MCG/ACT NA SUSP
1.0000 | Freq: Every day | NASAL | Status: DC
  Administered 2019-05-23: 1 via NASAL
  Filled 2019-05-23: qty 16

## 2019-05-23 MED ORDER — SODIUM CHLORIDE 0.9 % IV SOLN
2.0000 g | INTRAVENOUS | Status: DC
  Administered 2019-05-23 – 2019-05-24 (×2): 2 g via INTRAVENOUS
  Filled 2019-05-23 (×2): qty 20

## 2019-05-23 NOTE — Progress Notes (Signed)
PROGRESS NOTE    Jeanette Suarez  IOE:[id number] DOB: 10-31-1955 DOA: 05/20/2019 PCP: Richardean Chimera, MD   Brief Narrative:  64 year old with history of anemia, anxiety, depression, headache, HTN, Sjogren's syndrome presented to the ER febrile, shaking, shivering.  Diagnosed with sepsis secondary to urinary tract infection.  Started on IV fluids, vancomycin, cefepime.  Prelim blood cultures are growing gram-negative rods-E. coli which is pansensitive.   Assessment & Plan:   Principal Problem:   Sepsis secondary to UTI [facility]) Active Problems:   Hypertension   GERD (gastroesophageal reflux disease)   Anxiety with depression   Hypokalemia   Memory changes  Sepsis secondary to urinary tract infection, present on admission E. coli bacteremia Sepsis physiology has improved.  E. coli is pansensitive therefore transition meropenem to IV Rocephin. She still spiking fever overnight therefore we will continue to monitor her in the hospital.  Denies any abdominal pain.  Does not have any CVA tenderness therefore holding off on CT of the abdomen pelvis but if it becomes necessary will need to obtain to rule out pyelonephritis or any further complication. Repeat blood cultures today.  Acute metabolic encephalopathy, resolved Secondary to UTI Recent outpatient MRI showed chronic microvascular changes/atrophy TSH, B12 within normal limits.  Hypokalemia/hypophosphatemia Repletion ordered.  GERD PPI  Anxiety/depression Continue albuterol and alprazolam  Essential hypertension Not on any meds.  Currently soft blood pressure.   DVT prophylaxis: Lovenox Code Status: Full code Family Communication: None at bedside  Status is: Inpatient  Remains inpatient appropriate because:Hemodynamically unstable   Dispo: The patient is from: Home              Anticipated d/c is to: Home              Anticipated d/c date is=1 day              Patient currently is not medically stable to d/c.   Patient is still having fever overnight despite of being on IV antibiotics.  E. coli is sensitive to current antibiotic regimen.  If she continues to do so, will require CT of the abdomen pelvis to rule out any pyelonephritis or further intra-abdominal complication   Subjective: Overall feels better but still having low-grade fever overnight.  Denies any abdominal pain or CVA tenderness.  Review of Systems Otherwise negative except as per HPI, including: General: Denies fever, chills, night sweats or unintended weight loss. Resp: Denies cough, wheezing, shortness of breath. Cardiac: Denies chest pain, palpitations, orthopnea, paroxysmal nocturnal dyspnea. GI: Denies abdominal pain, nausea, vomiting, diarrhea or constipation GU: Denies dysuria, frequency, hesitancy or incontinence MS: Denies muscle aches, joint pain or swelling Neuro: Denies headache, neurologic deficits (focal weakness, numbness, tingling), abnormal gait Psych: Denies anxiety, depression, SI/HI/AVH Skin: Denies new rashes or lesions ID: Denies sick contacts, exotic exposures, travel  Examination: Constitutional: Not in acute distress Respiratory: Clear to auscultation bilaterally Cardiovascular: Normal sinus rhythm, no rubs Abdomen: Nontender nondistended good bowel sounds.  No CVA tenderness appreciated Musculoskeletal: No edema noted Skin: No rashes seen Neurologic: CN 2-12 grossly intact.  And nonfocal Psychiatric: Normal judgment and insight. Alert and oriented x 3. Normal mood.  Objective: Vitals:   05/22/19 2054 05/23/19 0427 05/23/19 0500 05/23/19 0540  BP: (!) 155/77 (!) 147/76    Pulse: 100 (!) 108 100   Resp: 17 16    Temp: 99 F (37.2 C) (!) 100.5 F (38.1 C)  98.6 F (37 C)  TempSrc: Oral Oral  Oral  SpO2: 97%  98%    Weight:      Height:        Intake/Output Summary (Last 24 hours) at 05/23/2019 0946 Last data filed at 05/23/2019 0600 Gross per 24 hour  Intake 2595 ml  Output 3302 ml  Net  -707 ml   Filed Weights   05/20/19 1730  Weight: 65.8 kg     Data Reviewed:   CBC: Recent Labs  Lab 05/20/19 1753 05/21/19 0454 05/22/19 0409 05/23/19 0556  WBC 23.6* 29.3* 15.5* 7.7  NEUTROABS 21.1* 25.9* 13.8* 6.1  HGB 12.6 11.2* 11.2* 10.4*  HCT 40.4 35.3* 36.6 32.2*  MCV 81.6 82.3 84.3 80.3  PLT 280 226 212 231   Basic Metabolic Panel: Recent Labs  Lab 05/20/19 1753 05/20/19 2002 05/21/19 0454 05/22/19 0409 05/23/19 0556  NA 137  --  140 138 139  K 3.1*  --  3.5 3.8 3.4*  CL 102  --  112* 109 110  CO2 26  --  21* 18* 22  GLUCOSE 112*  --  130* 114* 108*  BUN 15  --  15 9 6*  CREATININE 1.12*  --  1.08* 0.85 0.80  CALCIUM 8.7*  --  7.9* 8.0* 8.1*  MG  --  1.9  --  2.7* 2.0  PHOS  --  2.8 1.6* 1.7* 2.2*   GFR: Estimated Creatinine Clearance: 68.9 mL/min (by C-G formula based on SCr of 0.8 mg/dL). Liver Function Tests: Recent Labs  Lab 05/20/19 1753 05/21/19 0454 05/22/19 0409 05/23/19 0556  AST 35  --   --   --   ALT 19  --   --   --   ALKPHOS 84  --   --   --   BILITOT 0.8  --   --   --   PROT 7.7  --   --   --   ALBUMIN 3.9 3.1* 3.0* 3.0*   No results for input(s): LIPASE, AMYLASE in the last 168 hours. No results for input(s): AMMONIA in the last 168 hours. Coagulation Profile: Recent Labs  Lab 05/20/19 2002  INR 1.0   Cardiac Enzymes: No results for input(s): CKTOTAL, CKMB, CKMBINDEX, TROPONINI in the last 168 hours. BNP (last 3 results) No results for input(s): PROBNP in the last 8760 hours. HbA1C: No results for input(s): HGBA1C in the last 72 hours. CBG: Recent Labs  Lab 05/21/19 1141 05/23/19 0435  GLUCAP 100* 93   Lipid Profile: No results for input(s): CHOL, HDL, LDLCALC, TRIG, CHOLHDL, LDLDIRECT in the last 72 hours. Thyroid Function Tests: Recent Labs    05/20/19 2002  TSH 0.738   Anemia Panel: Recent Labs    05/21/19 0454  VITAMINB12 414   Sepsis Labs: Recent Labs  Lab 05/20/19 1753 05/20/19 1932    LATICACIDVEN 0.9 0.9    Recent Results (from the past 240 hour(s))  SARS Coronavirus 2 by RT PCR (hospital order, performed in [facility] hospital lab) Nasopharyngeal Nasopharyngeal Swab     Status: None   Collection Time: 05/20/19  7:31 PM   Specimen: Nasopharyngeal Swab  Result Value Ref Range Status   SARS Coronavirus 2 NEGATIVE NEGATIVE Final    Comment: (NOTE) SARS-CoV-2 target nucleic acids are NOT DETECTED. The SARS-CoV-2 RNA is generally detectable in upper and lower respiratory specimens during the acute phase of infection. The lowest concentration of SARS-CoV-2 viral copies this assay can detect is 250 copies / mL. A negative result does not preclude SARS-CoV-2 infection and should not be used  as the sole basis for treatment or other patient management decisions.  A negative result may occur with improper specimen collection / handling, submission of specimen other than nasopharyngeal swab, presence of viral mutation(s) within the areas targeted by this assay, and inadequate number of viral copies (<250 copies / mL). A negative result must be combined with clinical observations, patient history, and epidemiological information. Fact Sheet for Patients:   StrictlyIdeas.no Fact Sheet for Healthcare Providers: BankingDealers.co.za This test is not yet approved or cleared  by the [place] FDA and has been authorized for detection and/or diagnosis of SARS-CoV-2 by FDA under an Emergency Use Authorization (EUA).  This EUA will remain in effect (meaning this test can be used) for the duration of the COVID-19 declaration under Section 564(b)(1) of the Act, 21 U.S.C. section 360bbb-3(b)(1), unless the authorization is terminated or revoked sooner. Performed at [facility], [address] [place], [place] [address]   Blood Culture (routine x 2)     Status: None (Preliminary result)   Collection Time: 05/20/19  8:01 PM    Specimen: BLOOD RIGHT HAND  Result Value Ref Range Status   Specimen Description BLOOD RIGHT HAND  Final   Special Requests   Final    BOTTLES DRAWN AEROBIC AND ANAEROBIC Blood Culture adequate volume   Culture   Final    NO GROWTH 2 DAYS Performed at [facility], [address], [place], [place] [address]    Report Status PENDING  Incomplete  Blood Culture (routine x 2)     Status: Abnormal   Collection Time: 05/20/19  8:02 PM   Specimen: BLOOD  Result Value Ref Range Status   Specimen Description   Final    BLOOD LEFT ANTECUBITAL Performed at [facility], [address] [place], [place] [address]    Special Requests   Final    BOTTLES DRAWN AEROBIC AND ANAEROBIC Blood Culture adequate volume Performed at [facility], [address] [place], [place] [address]    Culture  Setup Time   Final    GRAM NEGATIVE RODS ANAEROBIC BOTTLE ONLY Gram Stain Report Called to,Read Back By and Verified With: WRIGHT,L ON 05/21/19 AT 1545 BY LOY,C PERFORMED AT [facility] CRITICAL RESULT CALLED TO, READ BACK BY AND VERIFIED WITH: H EVANS RN 05/21/19 2211 JDW Performed at [facility], [place] [address], [place], [place] [address]    Culture ESCHERICHIA COLI (A)  Final   Report Status 05/23/2019 FINAL  Final   Organism ID, Bacteria ESCHERICHIA COLI  Final      Susceptibility   Escherichia coli - MIC*    AMPICILLIN 4 SENSITIVE Sensitive     CEFAZOLIN <=4 SENSITIVE Sensitive     CEFEPIME <=1 SENSITIVE Sensitive     CEFTAZIDIME <=1 SENSITIVE Sensitive     CEFTRIAXONE <=1 SENSITIVE Sensitive     CIPROFLOXACIN <=0.25 SENSITIVE Sensitive     GENTAMICIN <=1 SENSITIVE Sensitive     IMIPENEM <=0.25 SENSITIVE Sensitive     TRIMETH/SULFA <=20 SENSITIVE Sensitive     AMPICILLIN/SULBACTAM <=2 SENSITIVE Sensitive     PIP/TAZO <=4 SENSITIVE Sensitive     * ESCHERICHIA COLI  Blood Culture ID Panel (Reflexed)     Status: Abnormal   Collection Time: 05/20/19  8:02 PM  Result Value Ref Range Status    Enterococcus species NOT DETECTED NOT DETECTED Final   Listeria monocytogenes NOT DETECTED NOT DETECTED Final   Staphylococcus species NOT DETECTED NOT DETECTED Final   Staphylococcus aureus (BCID) NOT  DETECTED NOT DETECTED Final   Streptococcus species NOT DETECTED NOT DETECTED Final   Streptococcus agalactiae NOT DETECTED NOT DETECTED Final   Streptococcus pneumoniae NOT DETECTED NOT DETECTED Final   Streptococcus pyogenes NOT DETECTED NOT DETECTED Final   Acinetobacter baumannii NOT DETECTED NOT DETECTED Final   Enterobacteriaceae species DETECTED (A) NOT DETECTED Final    Comment: Enterobacteriaceae represent a large family of gram-negative bacteria, not a single organism. CRITICAL RESULT CALLED TO, READ BACK BY AND VERIFIED WITH: H EVANS RN 05/21/19/2211 JDW    Enterobacter cloacae complex NOT DETECTED NOT DETECTED Final   Escherichia coli DETECTED (A) NOT DETECTED Final    Comment: CRITICAL RESULT CALLED TO, READ BACK BY AND VERIFIED WITH: H EVANS RN 05/21/19 2211 JDW    Klebsiella oxytoca NOT DETECTED NOT DETECTED Final   Klebsiella pneumoniae NOT DETECTED NOT DETECTED Final   Proteus species NOT DETECTED NOT DETECTED Final   Serratia marcescens NOT DETECTED NOT DETECTED Final   Carbapenem resistance NOT DETECTED NOT DETECTED Final   Haemophilus influenzae NOT DETECTED NOT DETECTED Final   Neisseria meningitidis NOT DETECTED NOT DETECTED Final   Pseudomonas aeruginosa NOT DETECTED NOT DETECTED Final   Candida albicans NOT DETECTED NOT DETECTED Final   Candida glabrata NOT DETECTED NOT DETECTED Final   Candida krusei NOT DETECTED NOT DETECTED Final   Candida parapsilosis NOT DETECTED NOT DETECTED Final   Candida tropicalis NOT DETECTED NOT DETECTED Final    Comment: Performed at [facility], [address] [place], [place] [address]  Urine culture     Status: Abnormal   Collection Time: 05/20/19  9:11 PM   Specimen: In/Out Cath Urine  Result Value Ref Range Status    Specimen Description   Final    IN/OUT CATH URINE Performed at [facility], [address], [place], [place] [address]    Special Requests   Final    NONE Performed at [facility], [address], [place], [place] [address]    Culture MULTIPLE SPECIES PRESENT, SUGGEST RECOLLECTION (A)  Final   Report Status 05/22/2019 FINAL  Final  MRSA PCR Screening     Status: None   Collection Time: 05/23/19  1:11 AM   Specimen: Nasal Mucosa; Nasopharyngeal  Result Value Ref Range Status   MRSA by PCR NEGATIVE NEGATIVE Final    Comment:        The GeneXpert MRSA Assay (FDA approved for NASAL specimens only), is one component of a comprehensive MRSA colonization surveillance program. It is not intended to diagnose MRSA infection nor to guide or monitor treatment for MRSA infections. Performed at [facility], [address], [place], [place] [address]          Radiology Studies: No results found.      Scheduled Meds: . enoxaparin (LOVENOX) injection  40 mg Subcutaneous Q24H  . potassium chloride  40 mEq Oral Once   Continuous Infusions: . sodium chloride 100 mL/hr at 05/22/19 1151  . cefTRIAXone (ROCEPHIN)  IV       LOS: 3 days   Time spent= 35 mins    Chantell Kunkler Joline Maxcy, MD [facility]  If 7PM-7AM, please contact night-coverage  05/23/2019, 9:46 AM

## 2019-05-23 NOTE — Progress Notes (Signed)
Patient voiding large amounts of urine>3000ML. Provider(Dr Jeannette How) paged and informed of this finding. Orders for labs initiated for evaluation

## 2019-05-24 DIAGNOSIS — I10 Essential (primary) hypertension: Secondary | ICD-10-CM

## 2019-05-24 DIAGNOSIS — E876 Hypokalemia: Secondary | ICD-10-CM

## 2019-05-24 DIAGNOSIS — K219 Gastro-esophageal reflux disease without esophagitis: Secondary | ICD-10-CM

## 2019-05-24 LAB — CBC WITH DIFFERENTIAL/PLATELET
Abs Immature Granulocytes: 0.06 10*3/uL (ref 0.00–0.07)
Basophils Absolute: 0 10*3/uL (ref 0.0–0.1)
Basophils Relative: 0 %
Eosinophils Absolute: 0.1 10*3/uL (ref 0.0–0.5)
Eosinophils Relative: 2 %
HCT: 35.6 % — ABNORMAL LOW (ref 36.0–46.0)
Hemoglobin: 10.9 g/dL — ABNORMAL LOW (ref 12.0–15.0)
Immature Granulocytes: 1 %
Lymphocytes Relative: 14 %
Lymphs Abs: 0.9 10*3/uL (ref 0.7–4.0)
MCH: 24.9 pg — ABNORMAL LOW (ref 26.0–34.0)
MCHC: 30.6 g/dL (ref 30.0–36.0)
MCV: 81.3 fL (ref 80.0–100.0)
Monocytes Absolute: 1.1 10*3/uL — ABNORMAL HIGH (ref 0.1–1.0)
Monocytes Relative: 16 %
Neutro Abs: 4.6 10*3/uL (ref 1.7–7.7)
Neutrophils Relative %: 67 %
Platelets: 241 10*3/uL (ref 150–400)
RBC: 4.38 MIL/uL (ref 3.87–5.11)
RDW: 14.7 % (ref 11.5–15.5)
WBC: 6.8 10*3/uL (ref 4.0–10.5)
nRBC: 0 % (ref 0.0–0.2)

## 2019-05-24 LAB — GLUCOSE, CAPILLARY: Glucose-Capillary: 89 mg/dL (ref 70–99)

## 2019-05-24 LAB — MAGNESIUM: Magnesium: 2 mg/dL (ref 1.7–2.4)

## 2019-05-24 MED ORDER — CEFDINIR 300 MG PO CAPS
600.0000 mg | ORAL_CAPSULE | Freq: Every day | ORAL | 0 refills | Status: AC
Start: 2019-05-25 — End: 2019-06-03

## 2019-05-24 NOTE — Discharge Instructions (Signed)
IMPORTANT INFORMATION: PAY CLOSE ATTENTION   PHYSICIAN DISCHARGE INSTRUCTIONS  Follow with Primary care provider  Daniel, Terry G, MD  and other consultants as instructed by your Hospitalist Physician  SEEK MEDICAL CARE OR RETURN TO EMERGENCY ROOM IF SYMPTOMS COME BACK, WORSEN OR NEW PROBLEM DEVELOPS   Please note: You were cared for by a hospitalist during your hospital stay. Every effort will be made to forward records to your primary care provider.  You can request that your primary care provider send for your hospital records if they have not received them.  Once you are discharged, your primary care physician will handle any further medical issues. Please note that NO REFILLS for any discharge medications will be authorized once you are discharged, as it is imperative that you return to your primary care physician (or establish a relationship with a primary care physician if you do not have one) for your post hospital discharge needs so that they can reassess your need for medications and monitor your lab values.  Please get a complete blood count and chemistry panel checked by your Primary MD at your next visit, and again as instructed by your Primary MD.  Get Medicines reviewed and adjusted: Please take all your medications with you for your next visit with your Primary MD  Laboratory/radiological data: Please request your Primary MD to go over all hospital tests and procedure/radiological results at the follow up, please ask your primary care provider to get all Hospital records sent to his/her office.  In some cases, they will be blood work, cultures and biopsy results pending at the time of your discharge. Please request that your primary care provider follow up on these results.  If you are diabetic, please bring your blood sugar readings with you to your follow up appointment with primary care.    Please call and make your follow up appointments as soon as possible.    Also Note  the following: If you experience worsening of your admission symptoms, develop shortness of breath, life threatening emergency, suicidal or homicidal thoughts you must seek medical attention immediately by calling 911 or calling your MD immediately  if symptoms less severe.  You must read complete instructions/literature along with all the possible adverse reactions/side effects for all the Medicines you take and that have been prescribed to you. Take any new Medicines after you have completely understood and accpet all the possible adverse reactions/side effects.   Do not drive when taking Pain medications or sleeping medications (Benzodiazepines)  Do not take more than prescribed Pain, Sleep and Anxiety Medications. It is not advisable to combine anxiety,sleep and pain medications without talking with your primary care practitioner  Special Instructions: If you have smoked or chewed Tobacco  in the last 2 yrs please stop smoking, stop any regular Alcohol  and or any Recreational drug use.  Wear Seat belts while driving.  Do not drive if taking any narcotic, mind altering or controlled substances or recreational drugs or alcohol.       

## 2019-05-24 NOTE — Discharge Summary (Signed)
Physician Discharge Summary  [name]:[id number] DOB: Jan 17, 1955 DOA: 05/20/2019  PCP: Richardean Chimera, MD  Admit date: 05/20/2019 Discharge date: 05/24/2019  Admitted From: Home  Disposition:  Home   Recommendations for Outpatient Follow-up:  1. Follow up with PCP in 1 weeks 2. Please follow up final blood culture results from 05/23/19  Discharge Condition: STABLE   CODE STATUS: FULL    Brief Hospitalization Summary: Please see all hospital notes, images, labs for full details of the hospitalization. ADMISSION HPI: Jeanette Suarez is a 64 y.o. female with medical history significant of anemia, anxiety, depression, GERD, hiatal hernia, history of headaches, hypertension, Sjogren Syndrome who is coming to the emergency department after her husband reports that he woke up last week as he was shivering and shaking to such appointment, that woke him up.  He noticed that she was covered in urine and sweat.  She was also confused.  She was unable to answer questions properly time.  He tried to convince her to come to the hospital, but she declined to come to the ED.  She woke up this morning oriented, but had body aches, chills, malaise, nausea and no appetite.  She also had a frontal headache.  No rhinorrhea, sore throat, wheezing, dyspnea or hemoptysis.  No chest pain, palpitations, diaphoresis, PND, orthopnea or pitting edema of the lower extremities.  She gets occasional postural dizziness.  She fell in the bathtub several months ago.  No emesis, diarrhea, constipation, melena or hematochezia.  No polyuria, polydipsia, polyphagia or blurred vision.  ED Course: Initial vital signs were temperature 99.6 F, pulse 114, Respirations 18, blood pressure 131/56 mmHg and O2 sat 97% on room air.  The patient's temperature subsequently increased to 103.2 F while she was seen in the ER.  She had at 2007 mL LR bolus.  She also received IV cefepime, vancomycin and metronidazole after blood cultures x2  were drawn.  I added magnesium and potassium supplementation.  Urinalysis with cloudy appearance, with small hemoglobinuria, ketonuria 5 and proteinuria 100 mg/dL.  Nitrites were positive, leukocyte esterase was moderate.  RBC was 0-5 WBC more than 50 per hpf and bacteria many. CBC showed a white count of 23.6 with 89% neutrophils, hemoglobin 12.6 g/dL and platelets 130. PT was 13.2, INR 1.0 and APTT 32. Lactic acid was 0.9 mmol/L twice.  CMP shows a potassium of 3.1 mmol/glucose was 112, creatinine 1.12 and calcium 8.7 mg/dL.  The rest of the measurements of the CMP were within expected values.  Imaging: Her chest radiograph showed mild increased lung markings without evidence of acute or active cardiopulmonary disease.  Left hip x-rays show moderate bilateral hip arthrosis, but no acute osseous abnormality.  Brief Narrative:  64 year old with history of anemia, anxiety, depression, headache, HTN, Sjogren's syndrome presented to the ER febrile, shaking, shivering.  Diagnosed with sepsis secondary to urinary tract infection.  Started on IV fluids, vancomycin, cefepime.  Prelim blood cultures are growing gram-negative rods-E. coli which is pansensitive.  Assessment & Plan:   Principal Problem:   Sepsis secondary to UTI    Hypertension   GERD (gastroesophageal reflux disease)   Anxiety with depression   Hypokalemia   Memory changes  Sepsis secondary to urinary tract infection, present on admission E. coli bacteremia Sepsis physiology has improved.  E. coli is pansensitive therefore transition meropenem to IV Rocephin.  Fever resolved now. Pt wants to go home.  Will discharge on oral cefdinir to complete 14 day course.  Repeat  blood cultures no growth to date from 05/23/19.  Acute metabolic encephalopathy, resolved Secondary to UTI Recent outpatient MRI showed chronic microvascular changes/atrophy TSH, B12 within normal  limits.  Hypokalemia/hypophosphatemia REPLETED  GERD PPI  Anxiety/depression Continue albuterol and alprazolam  Essential hypertension Follow up with PCP   DVT prophylaxis: Lovenox Code Status: Full code Family Communication: husband at bedside updated  Discharge Diagnoses:  Principal Problem:   Sepsis secondary to UTI [facility]) Active Problems:   Hypertension   GERD (gastroesophageal reflux disease)   Anxiety with depression   Hypokalemia   Memory changes   Discharge Instructions:  Allergies as of 05/24/2019      Reactions   Ace Inhibitors Cough   Sulfa Antibiotics Swelling, Rash, Cough   Swelling to hands and lips.      Medication List    TAKE these medications   ALPRAZolam 0.5 MG tablet Commonly known as: XANAX Take 0.25 mg by mouth 2 (two) times daily as needed.   buPROPion 150 MG 24 hr tablet Commonly known as: WELLBUTRIN XL Take 150 mg by mouth daily.   cefdinir 300 MG capsule Commonly known as: OMNICEF Take 2 capsules (600 mg total) by mouth daily for 9 days. Start taking on: May 25, 2019   cevimeline 30 MG capsule Commonly known as: EVOXAC Take 30 mg by mouth 2 (two) times daily.   Dotti 0.075 MG/24HR Generic drug: estradiol Place 1 patch onto the skin every 14 (fourteen) days.   Ferrex 150 150 MG capsule Generic drug: iron polysaccharides Take 1 capsule by mouth daily.   GenTeal Tears 0.1-0.2-0.3 % Soln Apply 1 drop to eye daily as needed (both eyes for dry eyes).   pantoprazole 40 MG tablet Commonly known as: PROTONIX Take 40 mg by mouth daily.   rosuvastatin 5 MG tablet Commonly known as: CRESTOR Take 5 mg by mouth daily.   sertraline 100 MG tablet Commonly known as: ZOLOFT Take 200 mg by mouth daily.      Follow-up Information    Richardean Chimera, MD. Schedule an appointment as soon as possible for a visit in 1 week(s).   Specialty: Family Medicine Why: Hospital Follow Up  Contact information: [address] [place]  [place] [address] [phone]          Allergies  Allergen Reactions  . Ace Inhibitors Cough  . Sulfa Antibiotics Swelling, Rash and Cough    Swelling to hands and lips.   Allergies as of 05/24/2019      Reactions   Ace Inhibitors Cough   Sulfa Antibiotics Swelling, Rash, Cough   Swelling to hands and lips.      Medication List    TAKE these medications   ALPRAZolam 0.5 MG tablet Commonly known as: XANAX Take 0.25 mg by mouth 2 (two) times daily as needed.   buPROPion 150 MG 24 hr tablet Commonly known as: WELLBUTRIN XL Take 150 mg by mouth daily.   cefdinir 300 MG capsule Commonly known as: OMNICEF Take 2 capsules (600 mg total) by mouth daily for 9 days. Start taking on: May 25, 2019   cevimeline 30 MG capsule Commonly known as: EVOXAC Take 30 mg by mouth 2 (two) times daily.   Dotti 0.075 MG/24HR Generic drug: estradiol Place 1 patch onto the skin every 14 (fourteen) days.   Ferrex 150 150 MG capsule Generic drug: iron polysaccharides Take 1 capsule by mouth daily.   GenTeal Tears 0.1-0.2-0.3 % Soln Apply 1 drop to eye daily as needed (both  eyes for dry eyes).   pantoprazole 40 MG tablet Commonly known as: PROTONIX Take 40 mg by mouth daily.   rosuvastatin 5 MG tablet Commonly known as: CRESTOR Take 5 mg by mouth daily.   sertraline 100 MG tablet Commonly known as: ZOLOFT Take 200 mg by mouth daily.       Procedures/Studies: MR BRAIN WO CONTRAST  Result Date: 05/12/2019 CLINICAL DATA:  Dizziness.  Fell in tub 3-4 months ago. EXAM: MRI HEAD WITHOUT CONTRAST TECHNIQUE: Multiplanar, multiecho pulse sequences of the brain and surrounding structures were obtained without intravenous contrast. COMPARISON:  None. FINDINGS: Brain: Mild atrophy without hydrocephalus. Mild chronic microvascular ischemic change in the white matter. Negative for acute infarct, hemorrhage, or mass. Vascular: Normal arterial flow voids Skull and upper cervical spine: Negative  Sinuses/Orbits: Negative Other: None IMPRESSION: Mild atrophy and mild chronic microvascular ischemic change in the white matter. No acute abnormality. Electronically Signed   By: Marlan Palau M.D.   On: 05/12/2019 16:12   DG Chest Port 1 View  Result Date: 05/20/2019 CLINICAL DATA:  Generalized body aches and chills. EXAM: PORTABLE CHEST 1 VIEW COMPARISON:  None. FINDINGS: Mild diffusely increased lung markings are seen without evidence of acute infiltrate, pleural effusion or pneumothorax. The heart size and mediastinal contours are within normal limits. The visualized skeletal structures are unremarkable. IMPRESSION: Mildly increased lung markings without evidence of acute or active cardiopulmonary disease. Electronically Signed   By: Aram Candela M.D.   On: 05/20/2019 21:27   DG HIP UNILAT WITH PELVIS 2-3 VIEWS LEFT  Result Date: 05/20/2019 CLINICAL DATA:  Left hip pain with urinary frequency, history of hysterectomy and colon cancer EXAM: DG HIP (WITH OR WITHOUT PELVIS) 2-3V LEFT COMPARISON:  None. FINDINGS: No acute bony abnormality. Specifically, no fracture, subluxation, or dislocation. Moderate bilateral hip arthrosis is present with corticated fragments along the superior acetabular margins which could reflect degenerative os acetabuli. No suspicious osseous lesions are clearly evident. Additional degenerative changes noted in the lower lumbar spine SI joints and symphysis pubis. Incomplete fusion of the posterior arch L5 is likely on the spectrum of spina bifida occulta though likely a benign incidental finding in a patient of this age. Atherosclerotic calcifications seen in the pelvis. IMPRESSION: 1. No acute osseous abnormality. 2. 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 3. Incomplete fusion posterior arch L5, congenital variant. Electronically Signed   By: Kreg Shropshire M.D.   On: 05/20/2019 21:28      Subjective: Pt reports feeling a lot better today.  She  would like to go home.  Pt eating and drinking well.  No specific complaints.   Discharge Exam: Vitals:   05/23/19 2122 05/24/19 0554  BP: 134/71 139/68  Pulse: 94 92  Resp: 18 18  Temp: 98.4 F (36.9 C) 99.6 F (37.6 C)  SpO2: 99% 99%   Vitals:   05/23/19 0540 05/23/19 1412 05/23/19 2122 05/24/19 0554  BP:  (!) 150/87 134/71 139/68  Pulse:  (!) 102 94 92  Resp:  18 18 18   Temp: 98.6 F (37 C) 98.9 F (37.2 C) 98.4 F (36.9 C) 99.6 F (37.6 C)  TempSrc: Oral  Oral Oral  SpO2:  99% 99% 99%  Weight:      Height:       General: Pt is alert, awake, not in acute distress Cardiovascular: RRR, S1/S2 +, no rubs, no gallops Respiratory: CTA bilaterally, no wheezing, no rhonchi Abdominal: Soft, NT, ND, bowel sounds + Extremities: no edema,  no cyanosis   The results of significant diagnostics from this hospitalization (including imaging, microbiology, ancillary and laboratory) are listed below for reference.     Microbiology: Recent Results (from the past 240 hour(s))  SARS Coronavirus 2 by RT PCR (hospital order, performed in [facility] hospital lab) Nasopharyngeal Nasopharyngeal Swab     Status: None   Collection Time: 05/20/19  7:31 PM   Specimen: Nasopharyngeal Swab  Result Value Ref Range Status   SARS Coronavirus 2 NEGATIVE NEGATIVE Final    Comment: (NOTE) SARS-CoV-2 target nucleic acids are NOT DETECTED. The SARS-CoV-2 RNA is generally detectable in upper and lower respiratory specimens during the acute phase of infection. The lowest concentration of SARS-CoV-2 viral copies this assay can detect is 250 copies / mL. A negative result does not preclude SARS-CoV-2 infection and should not be used as the sole basis for treatment or other patient management decisions.  A negative result may occur with improper specimen collection / handling, submission of specimen other than nasopharyngeal swab, presence of viral mutation(s) within the areas targeted by this assay, and  inadequate number of viral copies (<250 copies / mL). A negative result must be combined with clinical observations, patient history, and epidemiological information. Fact Sheet for Patients:   BoilerBrush.com.cy Fact Sheet for Healthcare Providers: https://pope.com/ This test is not yet approved or cleared  by the [place] FDA and has been authorized for detection and/or diagnosis of SARS-CoV-2 by FDA under an Emergency Use Authorization (EUA).  This EUA will remain in effect (meaning this test can be used) for the duration of the COVID-19 declaration under Section 564(b)(1) of the Act, 21 U.S.C. section 360bbb-3(b)(1), unless the authorization is terminated or revoked sooner. Performed at [facility], [address], [place], [place] [address]   Blood Culture (routine x 2)     Status: None (Preliminary result)   Collection Time: 05/20/19  8:01 PM   Specimen: BLOOD RIGHT HAND  Result Value Ref Range Status   Specimen Description BLOOD RIGHT HAND  Final   Special Requests   Final    BOTTLES DRAWN AEROBIC AND ANAEROBIC Blood Culture adequate volume   Culture   Final    NO GROWTH 3 DAYS Performed at [facility], [address] [place], [place] [address]    Report Status PENDING  Incomplete  Blood Culture (routine x 2)     Status: Abnormal   Collection Time: 05/20/19  8:02 PM   Specimen: BLOOD  Result Value Ref Range Status   Specimen Description   Final    BLOOD LEFT ANTECUBITAL Performed at [facility], [address], [place], [place] [address]    Special Requests   Final    BOTTLES DRAWN AEROBIC AND ANAEROBIC Blood Culture adequate volume Performed at [facility], [address] [place], [place] [address]    Culture  Setup Time   Final    GRAM NEGATIVE RODS ANAEROBIC BOTTLE ONLY Gram Stain Report Called to,Read Back By and Verified With: WRIGHT,L ON 05/21/19 AT 1545 BY LOY,C PERFORMED AT [facility] CRITICAL RESULT CALLED TO,  READ BACK BY AND VERIFIED WITH: H EVANS RN 05/21/19 2211 JDW Performed at [facility], [address] [place], [place] [address]    Culture ESCHERICHIA COLI (A)  Final   Report Status 05/23/2019 FINAL  Final   Organism ID, Bacteria ESCHERICHIA COLI  Final      Susceptibility   Escherichia coli - MIC*    AMPICILLIN 4 SENSITIVE Sensitive  CEFAZOLIN <=4 SENSITIVE Sensitive     CEFEPIME <=1 SENSITIVE Sensitive     CEFTAZIDIME <=1 SENSITIVE Sensitive     CEFTRIAXONE <=1 SENSITIVE Sensitive     CIPROFLOXACIN <=0.25 SENSITIVE Sensitive     GENTAMICIN <=1 SENSITIVE Sensitive     IMIPENEM <=0.25 SENSITIVE Sensitive     TRIMETH/SULFA <=20 SENSITIVE Sensitive     AMPICILLIN/SULBACTAM <=2 SENSITIVE Sensitive     PIP/TAZO <=4 SENSITIVE Sensitive     * ESCHERICHIA COLI  Blood Culture ID Panel (Reflexed)     Status: Abnormal   Collection Time: 05/20/19  8:02 PM  Result Value Ref Range Status   Enterococcus species NOT DETECTED NOT DETECTED Final   Listeria monocytogenes NOT DETECTED NOT DETECTED Final   Staphylococcus species NOT DETECTED NOT DETECTED Final   Staphylococcus aureus (BCID) NOT DETECTED NOT DETECTED Final   Streptococcus species NOT DETECTED NOT DETECTED Final   Streptococcus agalactiae NOT DETECTED NOT DETECTED Final   Streptococcus pneumoniae NOT DETECTED NOT DETECTED Final   Streptococcus pyogenes NOT DETECTED NOT DETECTED Final   Acinetobacter baumannii NOT DETECTED NOT DETECTED Final   Enterobacteriaceae species DETECTED (A) NOT DETECTED Final    Comment: Enterobacteriaceae represent a large family of gram-negative bacteria, not a single organism. CRITICAL RESULT CALLED TO, READ BACK BY AND VERIFIED WITH: H EVANS RN 05/21/19/2211 JDW    Enterobacter cloacae complex NOT DETECTED NOT DETECTED Final   Escherichia coli DETECTED (A) NOT DETECTED Final    Comment: CRITICAL RESULT CALLED TO, READ BACK BY AND VERIFIED WITH: H EVANS RN 05/21/19 2211 JDW    Klebsiella  oxytoca NOT DETECTED NOT DETECTED Final   Klebsiella pneumoniae NOT DETECTED NOT DETECTED Final   Proteus species NOT DETECTED NOT DETECTED Final   Serratia marcescens NOT DETECTED NOT DETECTED Final   Carbapenem resistance NOT DETECTED NOT DETECTED Final   Haemophilus influenzae NOT DETECTED NOT DETECTED Final   Neisseria meningitidis NOT DETECTED NOT DETECTED Final   Pseudomonas aeruginosa NOT DETECTED NOT DETECTED Final   Candida albicans NOT DETECTED NOT DETECTED Final   Candida glabrata NOT DETECTED NOT DETECTED Final   Candida krusei NOT DETECTED NOT DETECTED Final   Candida parapsilosis NOT DETECTED NOT DETECTED Final   Candida tropicalis NOT DETECTED NOT DETECTED Final    Comment: Performed at [facility], [address] [place], [place] [address]  Urine culture     Status: Abnormal   Collection Time: 05/20/19  9:11 PM   Specimen: In/Out Cath Urine  Result Value Ref Range Status   Specimen Description   Final    IN/OUT CATH URINE Performed at [facility], [address] [place], [place] [address]    Special Requests   Final    NONE Performed at [facility], [address] [place], [place] [address]    Culture MULTIPLE SPECIES PRESENT, SUGGEST RECOLLECTION (A)  Final   Report Status 05/22/2019 FINAL  Final  MRSA PCR Screening     Status: None   Collection Time: 05/23/19  1:11 AM   Specimen: Nasal Mucosa; Nasopharyngeal  Result Value Ref Range Status   MRSA by PCR NEGATIVE NEGATIVE Final    Comment:        The GeneXpert MRSA Assay (FDA approved for NASAL specimens only), is one component of a comprehensive MRSA colonization surveillance program. It is not intended to diagnose MRSA infection nor to guide or monitor treatment for MRSA infections. Performed at [facility], [address] [place], [place] [address]  Culture, blood (Routine X 2) w Reflex to ID Panel     Status: None (Preliminary result)   Collection Time: 05/23/19 10:15 AM   Specimen: Right  Antecubital; Blood  Result Value Ref Range Status   Specimen Description   Final    RIGHT ANTECUBITAL BOTTLES DRAWN AEROBIC AND ANAEROBIC   Special Requests   Final    Blood Culture adequate volume Performed at [facility], [address] [place], [place] [address]    Culture PENDING  Incomplete   Report Status PENDING  Incomplete  Culture, blood (Routine X 2) w Reflex to ID Panel     Status: None (Preliminary result)   Collection Time: 05/23/19 10:17 AM   Specimen: BLOOD RIGHT HAND  Result Value Ref Range Status   Specimen Description BLOOD RIGHT HAND BOTTLES DRAWN AEROBIC ONLY  Final   Special Requests   Final    Blood Culture adequate volume Performed at [facility], [address] [place], [place] [address]    Culture PENDING  Incomplete   Report Status PENDING  Incomplete     Labs: BNP (last 3 results) No results for input(s): BNP in the last 8760 hours. Basic Metabolic Panel: Recent Labs  Lab 05/20/19 1753 05/20/19 2002 05/21/19 0454 05/22/19 0409 05/23/19 0556 05/24/19 0619  NA 137  --  140 138 139  --   K 3.1*  --  3.5 3.8 3.4*  --   CL 102  --  112* 109 110  --   CO2 26  --  21* 18* 22  --   GLUCOSE 112*  --  130* 114* 108*  --   BUN 15  --  15 9 6*  --   CREATININE 1.12*  --  1.08* 0.85 0.80  --   CALCIUM 8.7*  --  7.9* 8.0* 8.1*  --   MG  --  1.9  --  2.7* 2.0 2.0  PHOS  --  2.8 1.6* 1.7* 2.2*  --    Liver Function Tests: Recent Labs  Lab 05/20/19 1753 05/21/19 0454 05/22/19 0409 05/23/19 0556  AST 35  --   --   --   ALT 19  --   --   --   ALKPHOS 84  --   --   --   BILITOT 0.8  --   --   --   PROT 7.7  --   --   --   ALBUMIN 3.9 3.1* 3.0* 3.0*   No results for input(s): LIPASE, AMYLASE in the last 168 hours. No results for input(s): AMMONIA in the last 168 hours. CBC: Recent Labs  Lab 05/20/19 1753 05/21/19 0454 05/22/19 0409 05/23/19 0556 05/24/19 0619  WBC 23.6* 29.3* 15.5* 7.7 6.8  NEUTROABS 21.1* 25.9* 13.8* 6.1 4.6  HGB 12.6  11.2* 11.2* 10.4* 10.9*  HCT 40.4 35.3* 36.6 32.2* 35.6*  MCV 81.6 82.3 84.3 80.3 81.3  PLT 280 226 212 231 241   Cardiac Enzymes: No results for input(s): CKTOTAL, CKMB, CKMBINDEX, TROPONINI in the last 168 hours. BNP: Invalid input(s): POCBNP CBG: Recent Labs  Lab 05/23/19 0435 05/23/19 1241 05/23/19 1742 05/23/19 2116 05/24/19 0729  GLUCAP 93 76 108* 79 89   D-Dimer No results for input(s): DDIMER in the last 72 hours. Hgb A1c No results for input(s): HGBA1C in the last 72 hours. Lipid Profile No results for input(s): CHOL, HDL, LDLCALC, TRIG, CHOLHDL, LDLDIRECT in the last 72 hours. Thyroid function studies No results for input(s): TSH, T4TOTAL,  T3FREE, THYROIDAB in the last 72 hours.  Invalid input(s): FREET3 Anemia work up No results for input(s): VITAMINB12, FOLATE, FERRITIN, TIBC, IRON, RETICCTPCT in the last 72 hours. Urinalysis    Component Value Date/Time   COLORURINE YELLOW 05/20/2019 2111   APPEARANCEUR HAZY (A) 05/20/2019 2111   LABSPEC 1.010 05/20/2019 2111   PHURINE 6.0 05/20/2019 2111   GLUCOSEU NEGATIVE 05/20/2019 2111   HGBUR SMALL (A) 05/20/2019 2111   BILIRUBINUR NEGATIVE 05/20/2019 2111   KETONESUR 5 (A) 05/20/2019 2111   PROTEINUR 100 (A) 05/20/2019 2111   NITRITE POSITIVE (A) 05/20/2019 2111   LEUKOCYTESUR MODERATE (A) 05/20/2019 2111   Sepsis Labs Invalid input(s): PROCALCITONIN,  WBC,  LACTICIDVEN Microbiology Recent Results (from the past 240 hour(s))  SARS Coronavirus 2 by RT PCR (hospital order, performed in [facility] hospital lab) Nasopharyngeal Nasopharyngeal Swab     Status: None   Collection Time: 05/20/19  7:31 PM   Specimen: Nasopharyngeal Swab  Result Value Ref Range Status   SARS Coronavirus 2 NEGATIVE NEGATIVE Final    Comment: (NOTE) SARS-CoV-2 target nucleic acids are NOT DETECTED. The SARS-CoV-2 RNA is generally detectable in upper and lower respiratory specimens during the acute phase of infection. The  lowest concentration of SARS-CoV-2 viral copies this assay can detect is 250 copies / mL. A negative result does not preclude SARS-CoV-2 infection and should not be used as the sole basis for treatment or other patient management decisions.  A negative result may occur with improper specimen collection / handling, submission of specimen other than nasopharyngeal swab, presence of viral mutation(s) within the areas targeted by this assay, and inadequate number of viral copies (<250 copies / mL). A negative result must be combined with clinical observations, patient history, and epidemiological information. Fact Sheet for Patients:   BoilerBrush.com.cy Fact Sheet for Healthcare Providers: https://pope.com/ This test is not yet approved or cleared  by the [place] FDA and has been authorized for detection and/or diagnosis of SARS-CoV-2 by FDA under an Emergency Use Authorization (EUA).  This EUA will remain in effect (meaning this test can be used) for the duration of the COVID-19 declaration under Section 564(b)(1) of the Act, 21 U.S.C. section 360bbb-3(b)(1), unless the authorization is terminated or revoked sooner. Performed at [facility], [address] [place], [place] [address]   Blood Culture (routine x 2)     Status: None (Preliminary result)   Collection Time: 05/20/19  8:01 PM   Specimen: BLOOD RIGHT HAND  Result Value Ref Range Status   Specimen Description BLOOD RIGHT HAND  Final   Special Requests   Final    BOTTLES DRAWN AEROBIC AND ANAEROBIC Blood Culture adequate volume   Culture   Final    NO GROWTH 3 DAYS Performed at [facility], [address] [place], [place] [address]    Report Status PENDING  Incomplete  Blood Culture (routine x 2)     Status: Abnormal   Collection Time: 05/20/19  8:02 PM   Specimen: BLOOD  Result Value Ref Range Status   Specimen Description   Final    BLOOD LEFT ANTECUBITAL Performed  at [facility], [address] [place], [place] [address]    Special Requests   Final    BOTTLES DRAWN AEROBIC AND ANAEROBIC Blood Culture adequate volume Performed at [facility], [address] [place], [place] [address]    Culture  Setup Time   Final    GRAM NEGATIVE RODS ANAEROBIC BOTTLE ONLY Gram Stain Report  Called to,Read Back By and Verified With: WRIGHT,L ON 05/21/19 AT 1545 BY LOY,C PERFORMED AT [facility] CRITICAL RESULT CALLED TO, READ BACK BY AND VERIFIED WITH: H EVANS RN 05/21/19 2211 JDW Performed at [facility], [place] [address], [place], [place] [address]    Culture ESCHERICHIA COLI (A)  Final   Report Status 05/23/2019 FINAL  Final   Organism ID, Bacteria ESCHERICHIA COLI  Final      Susceptibility   Escherichia coli - MIC*    AMPICILLIN 4 SENSITIVE Sensitive     CEFAZOLIN <=4 SENSITIVE Sensitive     CEFEPIME <=1 SENSITIVE Sensitive     CEFTAZIDIME <=1 SENSITIVE Sensitive     CEFTRIAXONE <=1 SENSITIVE Sensitive     CIPROFLOXACIN <=0.25 SENSITIVE Sensitive     GENTAMICIN <=1 SENSITIVE Sensitive     IMIPENEM <=0.25 SENSITIVE Sensitive     TRIMETH/SULFA <=20 SENSITIVE Sensitive     AMPICILLIN/SULBACTAM <=2 SENSITIVE Sensitive     PIP/TAZO <=4 SENSITIVE Sensitive     * ESCHERICHIA COLI  Blood Culture ID Panel (Reflexed)     Status: Abnormal   Collection Time: 05/20/19  8:02 PM  Result Value Ref Range Status   Enterococcus species NOT DETECTED NOT DETECTED Final   Listeria monocytogenes NOT DETECTED NOT DETECTED Final   Staphylococcus species NOT DETECTED NOT DETECTED Final   Staphylococcus aureus (BCID) NOT DETECTED NOT DETECTED Final   Streptococcus species NOT DETECTED NOT DETECTED Final   Streptococcus agalactiae NOT DETECTED NOT DETECTED Final   Streptococcus pneumoniae NOT DETECTED NOT DETECTED Final   Streptococcus pyogenes NOT DETECTED NOT DETECTED Final   Acinetobacter baumannii NOT DETECTED NOT DETECTED Final   Enterobacteriaceae species DETECTED (A) NOT  DETECTED Final    Comment: Enterobacteriaceae represent a large family of gram-negative bacteria, not a single organism. CRITICAL RESULT CALLED TO, READ BACK BY AND VERIFIED WITH: H EVANS RN 05/21/19/2211 JDW    Enterobacter cloacae complex NOT DETECTED NOT DETECTED Final   Escherichia coli DETECTED (A) NOT DETECTED Final    Comment: CRITICAL RESULT CALLED TO, READ BACK BY AND VERIFIED WITH: H EVANS RN 05/21/19 2211 JDW    Klebsiella oxytoca NOT DETECTED NOT DETECTED Final   Klebsiella pneumoniae NOT DETECTED NOT DETECTED Final   Proteus species NOT DETECTED NOT DETECTED Final   Serratia marcescens NOT DETECTED NOT DETECTED Final   Carbapenem resistance NOT DETECTED NOT DETECTED Final   Haemophilus influenzae NOT DETECTED NOT DETECTED Final   Neisseria meningitidis NOT DETECTED NOT DETECTED Final   Pseudomonas aeruginosa NOT DETECTED NOT DETECTED Final   Candida albicans NOT DETECTED NOT DETECTED Final   Candida glabrata NOT DETECTED NOT DETECTED Final   Candida krusei NOT DETECTED NOT DETECTED Final   Candida parapsilosis NOT DETECTED NOT DETECTED Final   Candida tropicalis NOT DETECTED NOT DETECTED Final    Comment: Performed at [facility], [place] [address] [place], [place] [address]  Urine culture     Status: Abnormal   Collection Time: 05/20/19  9:11 PM   Specimen: In/Out Cath Urine  Result Value Ref Range Status   Specimen Description   Final    IN/OUT CATH URINE Performed at [facility], [address] [place], [place] [address]    Special Requests   Final    NONE Performed at [facility], [address] [place], [place] [address]    Culture MULTIPLE SPECIES PRESENT, SUGGEST RECOLLECTION (A)  Final   Report Status 05/22/2019 FINAL  Final  MRSA  PCR Screening     Status: None   Collection Time: 05/23/19  1:11 AM   Specimen: Nasal Mucosa; Nasopharyngeal  Result Value Ref Range Status   MRSA by PCR NEGATIVE NEGATIVE Final    Comment:        The GeneXpert MRSA  Assay (FDA approved for NASAL specimens only), is one component of a comprehensive MRSA colonization surveillance program. It is not intended to diagnose MRSA infection nor to guide or monitor treatment for MRSA infections. Performed at [facility], [address], [place], [place] [address]   Culture, blood (Routine X 2) w Reflex to ID Panel     Status: None (Preliminary result)   Collection Time: 05/23/19 10:15 AM   Specimen: Right Antecubital; Blood  Result Value Ref Range Status   Specimen Description   Final    RIGHT ANTECUBITAL BOTTLES DRAWN AEROBIC AND ANAEROBIC   Special Requests   Final    Blood Culture adequate volume Performed at [facility], [address], [place], [place] [address]    Culture PENDING  Incomplete   Report Status PENDING  Incomplete  Culture, blood (Routine X 2) w Reflex to ID Panel     Status: None (Preliminary result)   Collection Time: 05/23/19 10:17 AM   Specimen: BLOOD RIGHT HAND  Result Value Ref Range Status   Specimen Description BLOOD RIGHT HAND BOTTLES DRAWN AEROBIC ONLY  Final   Special Requests   Final    Blood Culture adequate volume Performed at [facility], [address], [place], [place] [address]    Culture PENDING  Incomplete   Report Status PENDING  Incomplete   Time coordinating discharge: 32 minutes   SIGNED:  Standley Dakins, MD  [facility] 05/24/2019, 10:50 AM How to contact the [facility] Attending or Consulting provider 7A - 7P or covering provider during after hours 7P -7A, for this patient?  1. Check the care team in [facility] and look for a) attending/consulting [facility] provider listed and b) the [facility] team listed 2. Log into [email or web address] and use 's universal password to access. If you do not have the password, please contact the hospital operator. 3. Locate the [facility] provider you are looking for under [facility] and page to a number that you can be directly reached. 4. If you still have difficulty reaching  the provider, please page the [facility] (Director on Call) for the Hospitalists listed on amion for assistance.

## 2019-05-24 NOTE — Progress Notes (Signed)
IV removed and dc instructions reviewed.  Scripts sent to pharmacy.  Husband at bedside and will drive home

## 2019-05-25 LAB — CULTURE, BLOOD (ROUTINE X 2)
Culture: NO GROWTH
Special Requests: ADEQUATE

## 2019-05-28 LAB — CULTURE, BLOOD (ROUTINE X 2)
Culture: NO GROWTH
Culture: NO GROWTH
Special Requests: ADEQUATE
Special Requests: ADEQUATE

## 2020-01-12 ENCOUNTER — Other Ambulatory Visit: Payer: Self-pay | Admitting: Family Medicine

## 2020-01-12 DIAGNOSIS — R1084 Generalized abdominal pain: Secondary | ICD-10-CM

## 2020-01-15 ENCOUNTER — Ambulatory Visit (HOSPITAL_COMMUNITY)
Admission: RE | Admit: 2020-01-15 | Discharge: 2020-01-15 | Disposition: A | Discharge status: Home / Self Care | Payer: 59 | Source: Ambulatory Visit | Attending: Family Medicine | Admitting: Family Medicine

## 2020-01-15 ENCOUNTER — Other Ambulatory Visit: Payer: Self-pay

## 2020-01-15 DIAGNOSIS — R1084 Generalized abdominal pain: Secondary | ICD-10-CM | POA: Diagnosis not present

## 2020-01-15 IMAGING — US US ABDOMEN COMPLETE
1 series · 14 of 25 positions shown · non-contrast
Comparison: [DATE] and prior.

CLINICAL DATA: Generalized abdominal pain.

EXAM:
ABDOMEN ULTRASOUND COMPLETE

[Series 1: us abdomen complete · 14 of 120 slices shown]
[im 1/120]
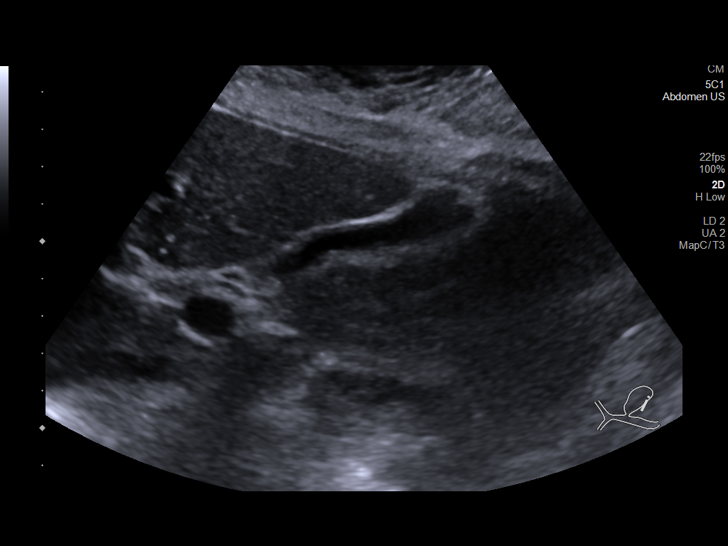
[im 10/120]
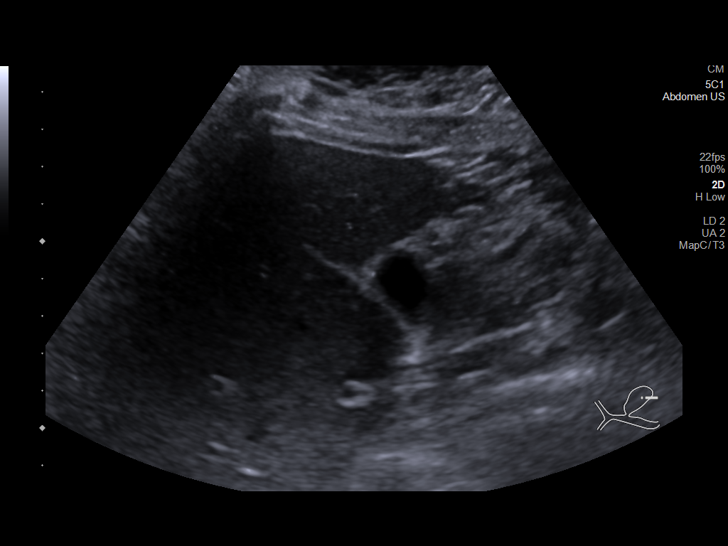
[im 20/120]
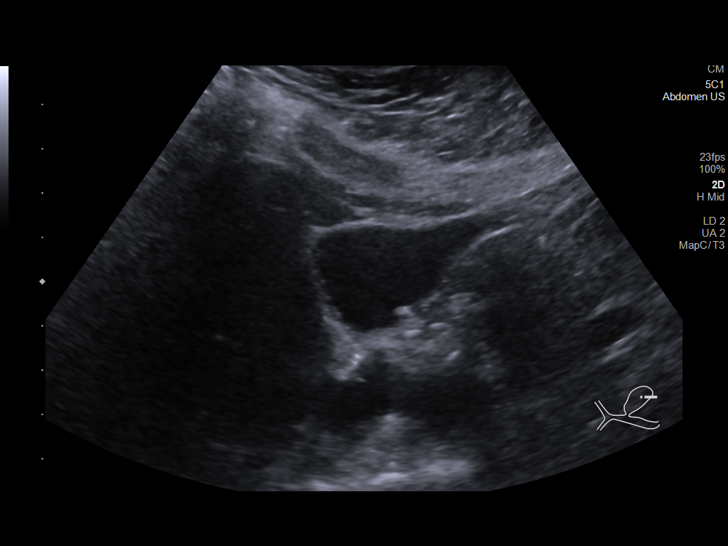
[im 30/120]
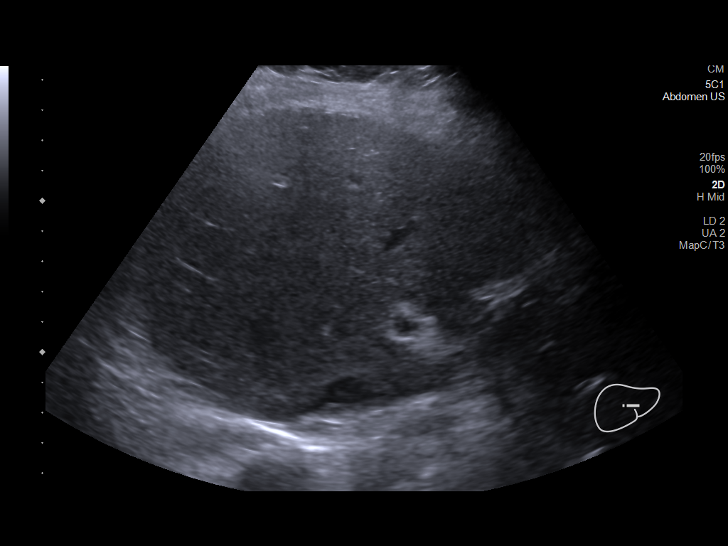
[im 40/120]
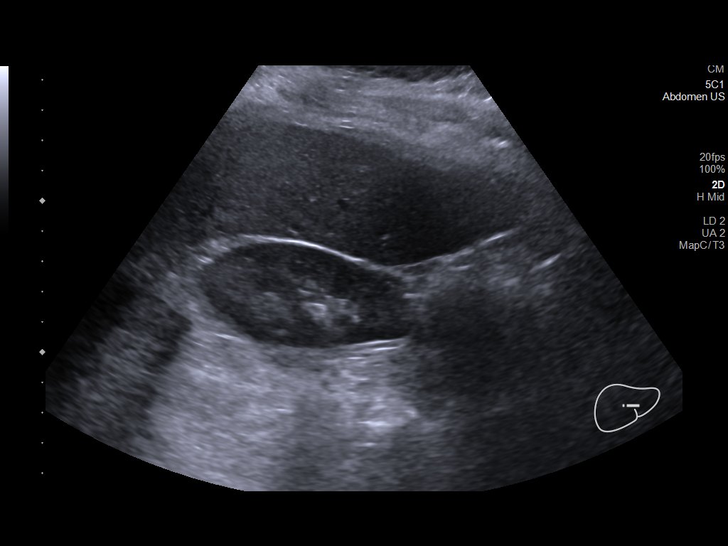
[im 45/120]
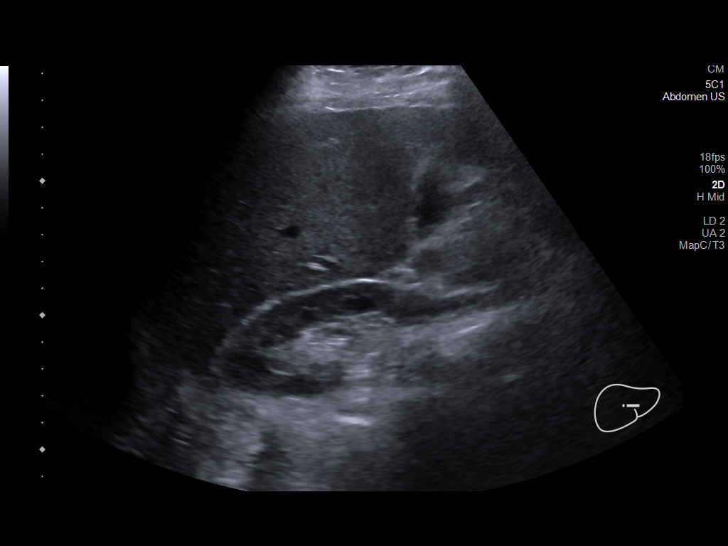
[im 55/120]
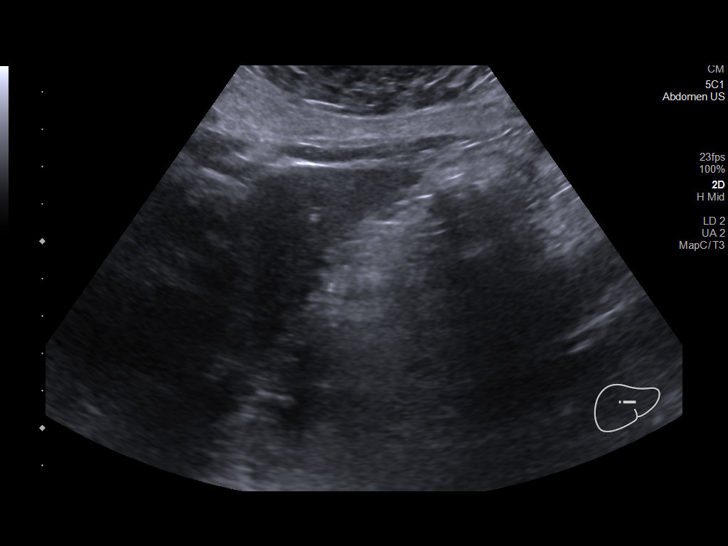
[im 65/120]
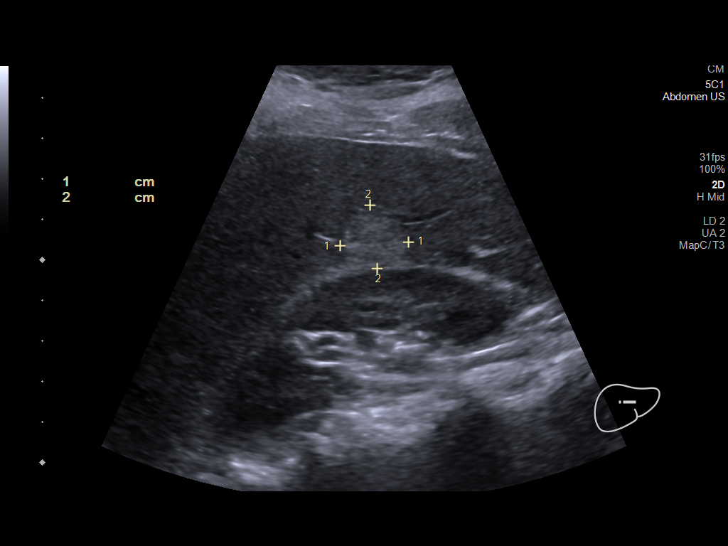
[im 75/120]
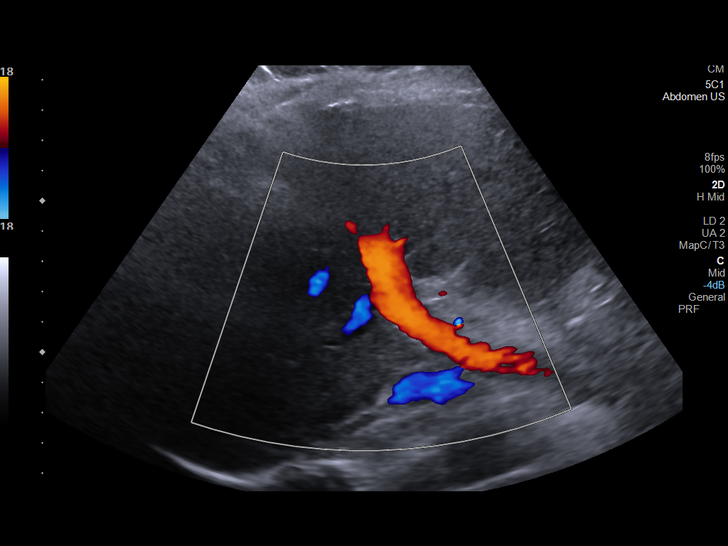
[im 80/120]
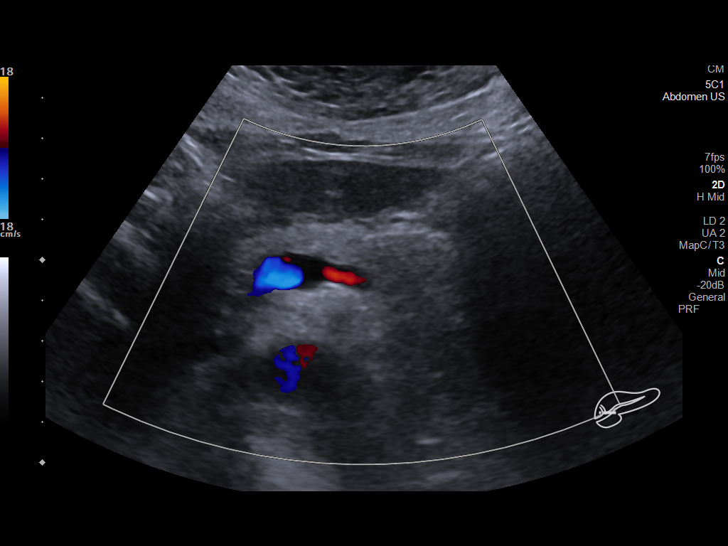
[im 90/120]
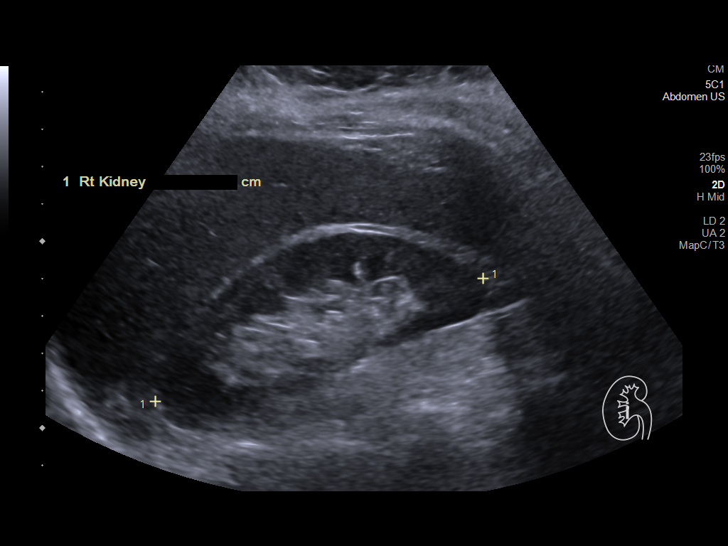
[im 100/120]
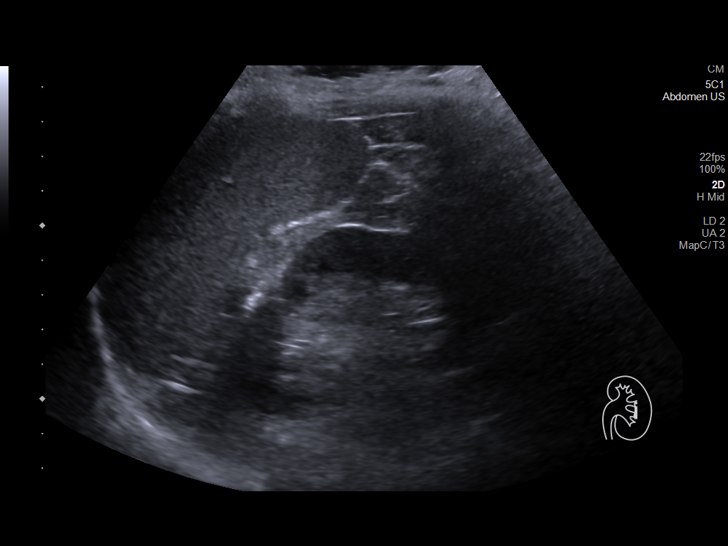
[im 110/120]
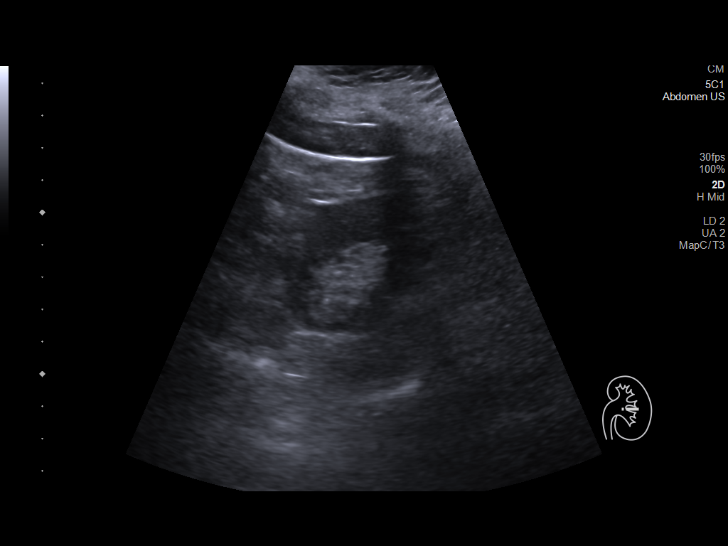
[im 120/120]
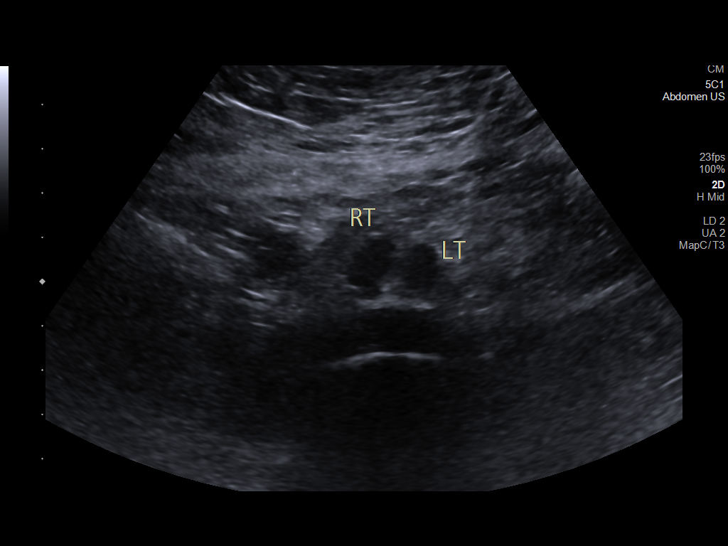

[14 of 25 positions shown; findings below may reference images not displayed]

FINDINGS: Gallbladder: No wall thickening visualized. Intraluminal calculus
measuring 4.5 mm. No sonographic Murphy sign noted by sonographer.

Common bile duct: Diameter: 2.9 mm

Liver: Hyperechoic lesion measuring 1.9 x 1.7 x 1.6 cm within the
dorsal right hepatic lobe likely reflects hemangioma seen on prior
CT. Within normal limits in parenchymal echogenicity. Portal vein is
patent on color Doppler imaging with normal direction of blood flow
towards the liver.

IVC: No abnormality visualized.

Pancreas: Visualized portion unremarkable.

Spleen: Size and appearance within normal limits.

Right Kidney: Length: 9.4 cm. Echogenicity within normal limits. No
mass or hydronephrosis visualized.

Left Kidney: Length: 10.0 cm. Echogenicity within normal limits. No
mass or hydronephrosis visualized.

Abdominal aorta: No aneurysm visualized.

Other findings: None.
IMPRESSION: Redemonstration of 1.9 cm posterior right hepatic hemangioma.

Cholelithiasis without cholecystitis.

## 2020-02-10 ENCOUNTER — Other Ambulatory Visit: Payer: Self-pay | Admitting: Family Medicine

## 2020-02-10 DIAGNOSIS — K8020 Calculus of gallbladder without cholecystitis without obstruction: Secondary | ICD-10-CM

## 2020-02-23 ENCOUNTER — Other Ambulatory Visit: Payer: Self-pay

## 2020-02-23 ENCOUNTER — Encounter: Payer: Self-pay | Admitting: General Surgery

## 2020-02-23 ENCOUNTER — Ambulatory Visit (INDEPENDENT_AMBULATORY_CARE_PROVIDER_SITE_OTHER): Payer: 59 | Admitting: General Surgery

## 2020-02-23 VITALS — BP 144/78 | HR 92 | Temp 97.6°F | Resp 12 | Ht 64.5 in | Wt 140.0 lb

## 2020-02-23 DIAGNOSIS — K8020 Calculus of gallbladder without cholecystitis without obstruction: Secondary | ICD-10-CM | POA: Diagnosis not present

## 2020-02-23 NOTE — Patient Instructions (Signed)
Cholelithiasis  Cholelithiasis is a disease in which gallstones form in the gallbladder. The gallbladder is an organ that stores bile. Bile is a fluid that helps to digest fats. Gallstones begin as small crystals and can slowly grow into stones. They may cause no symptoms until they block the gallbladder duct, or cystic duct, when the gallbladder tightens (contracts) after food is eaten. This can cause pain and is known as a gallbladder attack, or biliary colic. There are two main types of gallstones:  Cholesterol stones. These are the most common type of gallstone. These stones are made of hardened cholesterol and are usually yellow-green in color. Cholesterol is a fat-like substance that is made in the liver.  Pigment stones. These are dark in color and are made of a red-yellow substance, called bilirubin,that forms when hemoglobin from red blood cells breaks down. What are the causes? This condition may be caused by an imbalance in the different parts that make bile. This can happen if the bile:  Has too much bilirubin. This can happen in certain blood diseases, such as sickle cell anemia.  Has too much cholesterol.  Does not have enough bile salts. These salts help the body absorb and digest fats. In some cases, this condition can also be caused by the gallbladder not emptying completely or often enough. This is common during pregnancy. What increases the risk? The following factors may make you more likely to develop this condition:  Being female.  Having multiple pregnancies. Health care providers sometimes advise removing diseased gallbladders before future pregnancies.  Eating a diet that is heavy in fried foods, fat, and refined carbohydrates, such as white bread and white rice.  Being obese.  Being older than age 40.  Using medicines that contain female hormones (estrogen) for a long time.  Losing weight quickly.  Having a family history of gallstones.  Having certain  medical problems, such as: ? Diabetes mellitus. ? Cystic fibrosis. ? Crohn's disease. ? Cirrhosis or other long-term (chronic) liver disease. ? Certain blood diseases, such as sickle cell anemia or leukemia. What are the signs or symptoms? In many cases, having gallstones causes no symptoms. When you have gallstones but do not have symptoms, you have silent gallstones. If a gallstone blocks your bile duct, it can cause a gallbladder attack. The main symptom of a gallbladder attack is sudden pain in the upper right part of the abdomen. The pain:  Usually comes at night or after eating.  Can last for one hour or more.  Can spread to your right shoulder, back, or chest.  Can feel like indigestion. This is discomfort, burning, or fullness in your upper abdomen. If the bile duct is blocked for more than a few hours, it can cause an infection or inflammation of your gallbladder (cholecystitis), liver, or pancreas. This can cause:  Nausea or vomiting.  Bloating.  Pain in your abdomen that lasts for 5 hours or longer.  Tenderness in your upper abdomen, often in the upper right section and under your rib cage.  Fever or chills.  Skin or the white parts of your eyes turning yellow (jaundice). This usually happens when a stone has blocked bile from passing through the common bile duct.  Dark urine or light-colored stools. How is this diagnosed? This condition may be diagnosed based on:  A physical exam.  Your medical history.  Ultrasound.  CT scan.  MRI. You may also have other tests, including:  Blood tests to check for signs of an   infection or inflammation.  Cholescintigraphy, or HIDA scan. This is a scan of your gallbladder and bile ducts (biliary system) using non-harmful radioactive material and special cameras that can see the radioactive material.  Endoscopic retrograde cholangiopancreatogram. This involves inserting a small tube with a camera on the end (endoscope)  through your mouth to look at bile ducts and check for blockages. How is this treated? Treatment for this condition depends on the severity of the condition. Silent gallstones do not need treatment. Treatment may be needed if a blockage causes a gallbladder attack or other symptoms. Treatment may include:  Home care, if symptoms are not severe. ? During a simple gallbladder attack, stop eating and drinking for 12-24 hours (except for water and clear liquids). This helps to "cool down" your gallbladder. After 1 or 2 days, you can start to eat a diet of simple or clear foods, such as broths and crackers. ? You may also need medicines for pain or nausea or both. ? If you have cholecystitis and an infection, you will need antibiotics.  A hospital stay, if needed for pain control or for cholecystitis with severe infection.  Cholecystectomy, or surgery to remove your gallbladder. This is the most common treatment if all other treatments have not worked.  Medicines to break up gallstones. These are most effective at treating small gallstones. Medicines may be used for up to 6-12 months.  Endoscopic retrograde cholangiopancreatogram. A small basket can be attached to the endoscope and used to capture and remove gallstones, mainly those that are in the common bile duct. Follow these instructions at home: Medicines  Take over-the-counter and prescription medicines only as told by your health care provider.  If you were prescribed an antibiotic medicine, take it as told by your health care provider. Do not stop taking the antibiotic even if you start to feel better.  Ask your health care provider if the medicine prescribed to you requires you to avoid driving or using machinery. Eating and drinking  Drink enough fluid to keep your urine pale yellow. This is important during a gallbladder attack. Water and clear liquids are preferred.  Follow a healthy diet. This includes: ? Reducing fatty foods,  such as fried food and foods high in cholesterol. ? Reducing refined carbohydrates, such as white bread and white rice. ? Eating more fiber. Aim for foods such as almonds, fruit, and beans. Alcohol use  If you drink alcohol: ? Limit how much you use to:  0-1 drink a day for nonpregnant women.  0-2 drinks a day for men. ? Be aware of how much alcohol is in your drink. In the [place] one drink equals one 12 oz bottle of beer (355 mL), one 5 oz glass of wine (148 mL), or one 1 oz glass of hard liquor (44 mL). General instructions  Do not use any products that contain nicotine or tobacco, such as cigarettes, e-cigarettes, and chewing tobacco. If you need help quitting, ask your health care provider.  Maintain a healthy weight.  Keep all follow-up visits as told by your health care provider. These may include consultations with a surgeon or specialist. This is important. Where to find more information  National Institute of Diabetes and Digestive and Kidney Diseases: www.niddk.nih.gov Contact a health care provider if:  You think you have had a gallbladder attack.  You have been diagnosed with silent gallstones and you develop pain in your abdomen or indigestion.  You begin to have attacks more often.  You have   dark urine or light-colored stools. Get help right away if:  You have pain from a gallbladder attack that lasts for more than 2 hours.  You have pain in your abdomen that lasts for more than 5 hours or is getting worse.  You have a fever or chills.  You have nausea and vomiting that do not go away.  You develop jaundice. Summary  Cholelithiasis is a disease in which gallstones form in the gallbladder.  This condition may be caused by an imbalance in the different parts that make bile. This can happen if your bile has too much bilirubin or cholesterol, or does not have enough bile salts.  Treatment for gallstones depends on the severity of the condition. Silent  gallstones do not need treatment.  If gallstones cause a gallbladder attack or other symptoms, treatment usually involves not eating or drinking anything. Treatment may also include pain medicines and antibiotics, and it sometimes includes a hospital stay.  Surgery to remove the gallbladder is common if all other treatments have not worked. This information is not intended to replace advice given to you by your health care provider. Make sure you discuss any questions you have with your health care provider. Document Revised: 11/17/2018 Document Reviewed: 11/17/2018 Elsevier Patient Education  2021 Elsevier Inc.  

## 2020-02-24 NOTE — Progress Notes (Signed)
Jeanette Suarez; [id number]; Jun 07, 1955   HPI Patient is a 65 year old white female who was referred to my care by Dr. Reuel Boom for evaluation and treatment of cholelithiasis.  Patient was seen in the emergency room at [facility] recently for right upper quadrant abdominal pain and nausea.  Ultrasound of the gallbladder revealed cholelithiasis.  This episode occurred around January 02, 2020.  She states that it did improve.  She describes having 1 additional attack a year earlier.  She currently feels fine.  She tries to avoid any trigger foods.  She denies any fever, chills, or jaundice.  She does have some reflux disease. Past Medical History:  Diagnosis Date  . Anemia   . Anxiety   . Depression   . Family hx of colon cancer   . GERD (gastroesophageal reflux disease)   . Headache   . History of hiatal hernia   . Hypertension   . Sjogren's disease [facility])     Past Surgical History:  Procedure Laterality Date  . ABDOMINAL HYSTERECTOMY    . BIOPSY  07/22/2017   Procedure: BIOPSY;  Surgeon: Malissa Hippo, MD;  Location: [facility];  Service: Endoscopy;;  gastric polyp  . BUNIONECTOMY Bilateral 1992  . COLONOSCOPY N/A 07/22/2017   Procedure: COLONOSCOPY;  Surgeon: Malissa Hippo, MD;  Location: [facility];  Service: Endoscopy;  Laterality: N/A;  . ESOPHAGOGASTRODUODENOSCOPY N/A 07/22/2017   Procedure: ESOPHAGOGASTRODUODENOSCOPY (EGD);  Surgeon: Malissa Hippo, MD;  Location: [facility];  Service: Endoscopy;  Laterality: N/A;  7:30  . eye Bilateral    lasix    Family History  Problem Relation Age of Onset  . Hypertension Mother   . Colon cancer Sister     Current Outpatient Medications on File Prior to Visit  Medication Sig Dispense Refill  . ALPRAZolam (XANAX) 0.5 MG tablet Take 0.25 mg by mouth 2 (two) times daily as needed.     . Artificial Tear Solution (GENTEAL TEARS) 0.1-0.2-0.3 % SOLN Apply 1 drop to eye daily as needed (both eyes for dry eyes).    Marland Kitchen buPROPion  (WELLBUTRIN XL) 150 MG 24 hr tablet Take 150 mg by mouth daily.    . cevimeline (EVOXAC) 30 MG capsule Take 30 mg by mouth 2 (two) times daily.     . iron polysaccharides (NIFEREX) 150 MG capsule Take 1 capsule by mouth daily.    . pantoprazole (PROTONIX) 40 MG tablet Take 40 mg by mouth daily.    . rosuvastatin (CRESTOR) 5 MG tablet Take 5 mg by mouth daily.    . sertraline (ZOLOFT) 100 MG tablet Take 200 mg by mouth daily.     . DOTTI 0.075 MG/24HR Place 1 patch onto the skin every 14 (fourteen) days. (Patient not taking: Reported on 02/23/2020)     No current facility-administered medications on file prior to visit.    Allergies  Allergen Reactions  . Ace Inhibitors Cough  . Sulfa Antibiotics Swelling, Rash and Cough    Swelling to hands and lips.    Social History   Substance and Sexual Activity  Alcohol Use No    Social History   Tobacco Use  Smoking Status Never Smoker  Smokeless Tobacco Never Used    Review of Systems  Constitutional: Positive for malaise/fatigue.  HENT: Negative.   Eyes: Positive for blurred vision.  Respiratory: Positive for cough.   Cardiovascular: Negative.   Gastrointestinal: Positive for abdominal pain, heartburn and nausea.  Genitourinary: Negative.   Musculoskeletal: Negative.  Neurological: Negative.   Endo/Heme/Allergies: Negative.   Psychiatric/Behavioral: Negative.     Objective   Vitals:   02/23/20 1423  BP: (!) 144/78  Pulse: 92  Resp: 12  Temp: 97.6 F (36.4 C)  SpO2: 98%    Physical Exam Vitals reviewed.  Constitutional:      Appearance: Normal appearance. She is normal weight. She is not ill-appearing.  HENT:     Head: Normocephalic and atraumatic.  Eyes:     General: No scleral icterus. Cardiovascular:     Rate and Rhythm: Normal rate and regular rhythm.     Heart sounds: Normal heart sounds. No murmur heard. No friction rub. No gallop.   Pulmonary:     Effort: Pulmonary effort is normal. No respiratory  distress.     Breath sounds: Normal breath sounds. No stridor. No wheezing, rhonchi or rales.  Abdominal:     General: Abdomen is flat. Bowel sounds are normal. There is no distension.     Palpations: Abdomen is soft. There is no mass.     Tenderness: There is no abdominal tenderness. There is no guarding or rebound.     Hernia: No hernia is present.  Skin:    General: Skin is warm and dry.  Neurological:     Mental Status: She is alert and oriented to person, place, and time.    Primary care notes reviewed Assessment  Cholelithiasis, currently asymptomatic Plan   The patient is very resistant to having any surgery at the present time.  She does realize that she may have further attacks in the future.  She realizes that surgery is the only treatment at this point for cholelithiasis.  Literature was given.  I did tell her that if her attacks increase in intensity and/or severity, she should reconsider cholecystectomy.  She understands this and agrees.  Follow-up as needed.

## 2020-09-26 ENCOUNTER — Other Ambulatory Visit: Payer: Self-pay

## 2020-09-26 ENCOUNTER — Encounter: Payer: Self-pay | Admitting: Physician Assistant

## 2020-09-26 ENCOUNTER — Ambulatory Visit (INDEPENDENT_AMBULATORY_CARE_PROVIDER_SITE_OTHER): Payer: 59 | Admitting: Physician Assistant

## 2020-09-26 VITALS — BP 137/72 | HR 91 | Resp 18 | Ht 61.5 in | Wt 132.0 lb

## 2020-09-26 DIAGNOSIS — R413 Other amnesia: Secondary | ICD-10-CM

## 2020-09-26 DIAGNOSIS — F3289 Other specified depressive episodes: Secondary | ICD-10-CM | POA: Diagnosis not present

## 2020-09-26 NOTE — Progress Notes (Addendum)
Assessment/Plan:   Jeanette Suarez is a 65 y.o. year old female with risk factors including   UTI sepsis 05/2019, hypertension, Sjogren's disease, hyperlipidemia, Iron deficiency anemia, anxiety, depression and  seen today for evaluation of memory loss. MoCA today is 13/30 with deficiencies in visuospatial/executive, memory, attention, language, abstraction, delayed recall  0/5, orientation 5/6    Recommendations:   Memory loss   MRI brain with/without contrast to assess for underlying structural abnormality and assess vascular load  Neurocognitive testing to further evaluate cognitive concerns and determine underlying cause of memory changes, including potential contribution from sleep, anxiety, or depression  Referral to Psychiatry for depression Check B12, TSH, ESR, CRP,ANA ,CBC, CMP Discussed safety both in and out of the home.  Discussed the importance of regular daily schedule with inclusion of crossword puzzles to maintain brain function.  Continue to monitor mood with PCP.  Stay active at least 30 minutes at least 3 times a week.  Naps should be scheduled and should be no longer than 60 minutes and should not occur after 2 PM.  Mediterranean diet is recommended  Folllow up in 2 months    Subjective:    The patient is seen in neurologic consultation at the request of Jeanette Bis, MD for the evaluation of memory.  The patient is accompanied by husband in room and daughter on the cell phone, who supplement the history.She a 65 y.o. 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  It was at the same time, that she had a hospitalization for UTI sepsis requiring IV antibiotics.  During that year, the patient began to notice confusion when going to her room, wondering what was she therefore.  She also began to repeat the same stories and questions.  Initially, she thought that this was minor, but at some point he began to concern  her.  Her daughter says that "she is not what she was used to be ".  Her husband reports the same.  "She is usually top-notch, and she is now in a fog ".  Her and her family denies any mood changes.  She does carry a history of depression since age 28 after divorcing the first husband.  At the time, she was placed in Zoloft and she has remained in the medicine since.  She denies any behavioral changes with this med.  It takes her some time to go to sleep, but denies any vivid dreams, or sleepwalking.  Denies hallucinations or paranoia.  Denies leaving objects in unusual places.  She is independent of bathing and dressing.  Occasionally she forgets to take a dose of the medication.  Her husband and her paid the bills together, as she forgets at times to do so.  Her appetite is good, denies trouble swallowing.  Of note, because of her history of Sjogren's syndrome, her mouth is dry, causing her some difficulty to swallow.  She takes Evoxac with some relief.  She cooks and denies leaving the stove on or the faucet on.  She ambulates without difficulty without the use of a walker or a cane.  She denies any falls or head injuries.  She drives with GPS, as she is afraid of getting lost.  She denies any headaches, or double visions.  She has intermittent periods of dizziness if she was moving too fast, but no vertigo.  She denies any focal numbness or tingling, unilateral weakness or  tremors, urine incontinence, retention, or constipation.  She has issues with diarrhea due to a history of gallbladder disease.  Denies anosmia, denies a history of sleep apnea, alcohol, tobacco.  Family history is negative for dementia.  Of note, the patient states that she takes B12 injections, but we are unable to find any pertinent labs at this time.     MRI brain 05/12/19 wo contrast  Mild atrophy and mild chronic microvascular ischemic change in the white matter. No acute abnormality.    Allergies  Allergen Reactions   Ace  Inhibitors Cough   Sulfa Antibiotics Swelling, Rash and Cough    Swelling to hands and lips.    Current Outpatient Medications  Medication Instructions   ALPRAZolam (XANAX) 0.25 mg, Oral, 2 times daily PRN   Artificial Tear Solution (GENTEAL TEARS) 0.1-0.2-0.3 % SOLN 1 drop, Ophthalmic, Daily PRN   buPROPion (WELLBUTRIN XL) 150 mg, Oral, Daily   cevimeline (EVOXAC) 30 mg, Oral, 2 times daily   DOTTI 0.075 MG/24HR 1 patch, Transdermal, Every 14 days   iron polysaccharides (NIFEREX) 150 MG capsule 1 capsule, Oral, Daily   pantoprazole (PROTONIX) 40 mg, Oral, Daily   rosuvastatin (CRESTOR) 5 mg, Oral, Daily   sertraline (ZOLOFT) 200 mg, Oral, Daily     VITALS:   Vitals:   09/26/20 1420  BP: 137/72  Pulse: 91  Resp: 18  SpO2: 97%  Weight: 132 lb (59.9 kg)  Height: 5' 1.5" (1.562 m)   No flowsheet data found.  PHYSICAL EXAM   HEENT:  Normocephalic, atraumatic. The mucous membranes are moist. The superficial temporal arteries are without ropiness or tenderness. Cardiovascular: Regular rate and rhythm. Lungs: Clear to auscultation bilaterally. Neck: There are no carotid bruits noted bilaterally.  NEUROLOGICAL: Montreal Cognitive Assessment  09/26/2020  Visuospatial/ Executive (0/5) 2  Naming (0/3) 3  Attention: Read list of digits (0/2) 2  Attention: Read list of letters (0/1) 0  Attention: Serial 7 subtraction starting at 100 (0/3) 0  Language: Repeat phrase (0/2) 1  Language : Fluency (0/1) 0  Abstraction (0/2) 0  Delayed Recall (0/5) 0  Orientation (0/6) 5  Total 13  Adjusted Score (based on education) 13   No flowsheet data found.  No flowsheet data found.   Orientation:  Alert and oriented to person, place and time. No aphasia or dysarthria. However she takes time to answer questions .Fund of knowledge is reduced. Recent memory impaired and remote memory intact.  Attention and concentration are reduced.  Able to name objects and repeat phrases. Delayed recall   0/5. Unable to draw a cube or draw the hands pf the clock properly  Cranial nerves: There is good facial symmetry. Extraocular muscles are intact and visual fields are full to confrontational testing. Speech is fluent and clear. Soft palate rises symmetrically and there is no tongue deviation. Hearing is intact to conversational tone. Tone: Tone is good throughout. Sensation: Sensation is intact to light touch and pinprick throughout. Vibration is intact at the bilateral big toe.There is no extinction with double simultaneous stimulation. There is no sensory dermatomal level identified. Coordination: The patient has no difficulty with RAM's or FNF bilaterally. Normal finger to nose  Motor: Strength is 5/5 in the bilateral upper and lower extremities. There is no pronator drift. There are no fasciculations noted. DTR's: Deep tendon reflexes are 2/4 at the bilateral biceps, triceps, brachioradialis, patella and achilles.  Plantar responses are downgoing bilaterally. No Huffman's sign Gait and Station: The patient is able to  ambulate without difficulty.The patient is able to heel toe walk without any difficulty.The patient is able to ambulate in a tandem fashion. The patient is able to stand in the Romberg position.     Thank you for allowing [place] the opportunity to participate in the care of this nice patient. Please do not hesitate to contact us for any questions or concerns.   Total time spent on today's visit was  60 minutes, including both face-to-face time and nonface-to-face time.  Time included that spent on review of records (prior notes available to me/labs/imaging if pertinent), discussing treatment and goals, answering patient's questions and coordinating care.  Cc:  Jeanette Bis, MD  Sharene Butters 09/26/2020 3:03 PM

## 2020-09-26 NOTE — Patient Instructions (Addendum)
It was a pleasure to see you today at our office.   Recommendations:  Follow up in  2 months  Neurocognitive evaluation at our office MRI of the brain, the radiology office will call you to arrange you appointment Check lab  Referral to Psychiatry for depression   RECOMMENDATIONS FOR ALL PATIENTS WITH MEMORY PROBLEMS: 1. Continue to exercise (Recommend 30 minutes of walking everyday, or 3 hours every week) 2. Increase social interactions - continue going to [place] and enjoy social gatherings with friends and family 3. Eat healthy, avoid fried foods and eat more fruits and vegetables 4. Maintain adequate blood pressure, blood sugar, and blood cholesterol level. Reducing the risk of stroke and cardiovascular disease also helps promoting better memory. 5. Avoid stressful situations. Live a simple life and avoid aggravations. Organize your time and prepare for the next day in anticipation. 6. Sleep well, avoid any interruptions of sleep and avoid any distractions in the bedroom that may interfere with adequate sleep quality 7. Avoid sugar, avoid sweets as there is a strong link between excessive sugar intake, diabetes, and cognitive impairment We discussed the Mediterranean diet, which has been shown to help patients reduce the risk of progressive memory disorders and reduces cardiovascular risk. This includes eating fish, eat fruits and green leafy vegetables, nuts like almonds and hazelnuts, walnuts, and also use olive oil. Avoid fast foods and fried foods as much as possible. Avoid sweets and sugar as sugar use has been linked to worsening of memory function.  There is always a concern of gradual progression of memory problems. If this is the case, then we may need to adjust level of care according to patient needs. Support, both to the patient and caregiver, should then be put into place.      You have been referred for a neuropsychological evaluation (i.e., evaluation of memory and thinking  abilities). Please bring someone with you to this appointment if possible, as it is helpful for the doctor to hear from both you and another adult who knows you well. Please bring eyeglasses and hearing aids if you wear them.    The evaluation will take approximately 3 hours and has two parts:   The first part is a clinical interview with the neuropsychologist (Dr. Milbert Coulter or Dr. Roseanne Reno). During the interview, the neuropsychologist will speak with you and the individual you brought to the appointment.    The second part of the evaluation is testing with the doctor's technician Annabelle Harman or Selena Batten). During the testing, the technician will ask you to remember different types of material, solve problems, and answer some questionnaires. Your family member will not be present for this portion of the evaluation.   Please note: We must reserve several hours of the neuropsychologist's time and the psychometrician's time for your evaluation appointment. As such, there is a No-Show fee of $100. If you are unable to attend any of your appointments, please contact our office as soon as possible to reschedule.    FALL PRECAUTIONS: Be cautious when walking. Scan the area for obstacles that may increase the risk of trips and falls. When getting up in the mornings, sit up at the edge of the bed for a few minutes before getting out of bed. Consider elevating the bed at the head end to avoid drop of blood pressure when getting up. Walk always in a well-lit room (use night lights in the walls). Avoid area rugs or power cords from appliances in the middle of the walkways. Use  a walker or a cane if necessary and consider physical therapy for balance exercise. Get your eyesight checked regularly.  FINANCIAL OVERSIGHT: Supervision, especially oversight when making financial decisions or transactions is also recommended.  HOME SAFETY: Consider the safety of the kitchen when operating appliances like stoves, microwave oven, and  blender. Consider having supervision and share cooking responsibilities until no longer able to participate in those. Accidents with firearms and other hazards in the house should be identified and addressed as well.   ABILITY TO BE LEFT ALONE: If patient is unable to contact 911 operator, consider using LifeLine, or when the need is there, arrange for someone to stay with patients. Smoking is a fire hazard, consider supervision or cessation. Risk of wandering should be assessed by caregiver and if detected at any point, supervision and safe proof recommendations should be instituted.  MEDICATION SUPERVISION: Inability to self-administer medication needs to be constantly addressed. Implement a mechanism to ensure safe administration of the medications.   DRIVING: Regarding driving, in patients with progressive memory problems, driving will be impaired. We advise to have someone else do the driving if trouble finding directions or if minor accidents are reported. Independent driving assessment is available to determine safety of driving.   If you are interested in the driving assessment, you can contact the following:  [employer] in [place]  [place] [phone] or [phone]    [place] refers to food and lifestyle choices that are based on the traditions of countries located on the [employer]. This way of eating has been shown to help prevent certain conditions and improve outcomes for people who have chronic diseases, like kidney disease and heart disease. What are tips for following this plan? Lifestyle  Cook and eat meals together with your family, when possible. Drink enough fluid to keep your urine clear or pale yellow. Be physically active every day. This includes: Aerobic exercise like running or swimming. Leisure  activities like gardening, walking, or housework. Get 7-8 hours of sleep each night. If recommended by your health care provider, drink red wine in moderation. This means 1 glass a day for nonpregnant women and 2 glasses a day for men. A glass of wine equals 5 oz (150 mL). Reading food labels  Check the serving size of packaged foods. For foods such as rice and pasta, the serving size refers to the amount of cooked product, not dry. Check the total fat in packaged foods. Avoid foods that have saturated fat or trans fats. Check the ingredients list for added sugars, such as corn syrup. Shopping  At the grocery store, buy most of your food from the areas near the walls of the store. This includes: Fresh fruits and vegetables (produce). Grains, beans, nuts, and seeds. Some of these may be available in unpackaged forms or large amounts (in bulk). Fresh seafood. Poultry and eggs. Low-fat dairy products. Buy whole ingredients instead of prepackaged foods. Buy fresh fruits and vegetables in-season from local farmers markets. Buy frozen fruits and vegetables in resealable bags. If you do not have access to quality fresh seafood, buy precooked frozen shrimp or canned fish, such as tuna, salmon, or sardines. Buy small amounts of raw or cooked vegetables, salads, or olives from the deli or salad bar at your store. Stock your pantry so you always have certain foods on hand, such as olive oil, canned tuna, canned tomatoes, rice, pasta,  and beans. Cooking  Cook foods with extra-virgin olive oil instead of using butter or other vegetable oils. Have meat as a side dish, and have vegetables or grains as your main dish. This means having meat in small portions or adding small amounts of meat to foods like pasta or stew. Use beans or vegetables instead of meat in common dishes like chili or lasagna. Experiment with different cooking methods. Try roasting or broiling vegetables instead of steaming or sauteing  them. Add frozen vegetables to soups, stews, pasta, or rice. Add nuts or seeds for added healthy fat at each meal. You can add these to yogurt, salads, or vegetable dishes. Marinate fish or vegetables using olive oil, lemon juice, garlic, and fresh herbs. Meal planning  Plan to eat 1 vegetarian meal one day each week. Try to work up to 2 vegetarian meals, if possible. Eat seafood 2 or more times a week. Have healthy snacks readily available, such as: Vegetable sticks with hummus. Greek yogurt. Fruit and nut trail mix. Eat balanced meals throughout the week. This includes: Fruit: 2-3 servings a day Vegetables: 4-5 servings a day Low-fat dairy: 2 servings a day Fish, poultry, or lean meat: 1 serving a day Beans and legumes: 2 or more servings a week Nuts and seeds: 1-2 servings a day Whole grains: 6-8 servings a day Extra-virgin olive oil: 3-4 servings a day Limit red meat and sweets to only a few servings a month What are my food choices? Mediterranean diet Recommended Grains: Whole-grain pasta. Brown rice. Bulgar wheat. Polenta. Couscous. Whole-wheat bread. [place]. Vegetables: Artichokes. Beets. Broccoli. Cabbage. Carrots. Eggplant. Green beans. Chard. Kale. Spinach. Onions. Leeks. Peas. Squash. Tomatoes. Peppers. Radishes. Fruits: Apples. Apricots. Avocado. Berries. Bananas. Cherries. Dates. Figs. Grapes. Lemons. Melon. Oranges. Peaches. Plums. Pomegranate. Meats and other protein foods: Beans. Almonds. Sunflower seeds. Pine nuts. Peanuts. [place]. Salmon. Scallops. Shrimp. [place]. Tilapia. Clams. Oysters. Eggs. Dairy: Low-fat milk. Cheese. Greek yogurt. Beverages: Water. Red wine. Herbal tea. Fats and oils: Extra virgin olive oil. Avocado oil. Grape seed oil. Sweets and desserts: [place] yogurt with honey. Baked apples. Poached pears. Trail mix. Seasoning and other foods: Basil. Cilantro. Coriander. Cumin. Mint. Parsley. Sage. Rosemary. Tarragon. Garlic. Oregano. Thyme. Pepper.  Balsalmic vinegar. Tahini. Hummus. Tomato sauce. Olives. Mushrooms. Limit these Grains: Prepackaged pasta or rice dishes. Prepackaged cereal with added sugar. Vegetables: Deep fried potatoes (french fries). Fruits: Fruit canned in syrup. Meats and other protein foods: Beef. Pork. Lamb. Poultry with skin. Hot dogs. [place] Salines. Dairy: Ice cream. Sour cream. Whole milk. Beverages: Juice. Sugar-sweetened soft drinks. Beer. Liquor and spirits. Fats and oils: Butter. Canola oil. Vegetable oil. Beef fat (tallow). Lard. Sweets and desserts: Cookies. Cakes. Pies. Candy. Seasoning and other foods: Mayonnaise. Premade sauces and marinades. The items listed may not be a complete list. Talk with your dietitian about what dietary choices are right for you. Summary The Mediterranean diet includes both food and lifestyle choices. Eat a variety of fresh fruits and vegetables, beans, nuts, seeds, and whole grains. Limit the amount of red meat and sweets that you eat. Talk with your health care provider about whether it is safe for you to drink red wine in moderation. This means 1 glass a day for nonpregnant women and 2 glasses a day for men. A glass of wine equals 5 oz (150 mL). This information is not intended to replace advice given to you by your health care provider. Make sure you discuss any questions you have with your health care provider.  Document Released: 08/18/2015 Document Revised: 09/20/2015 Document Reviewed: 08/18/2015 Elsevier Interactive Patient Education  2017 [employer].

## 2020-09-28 ENCOUNTER — Telehealth: Payer: Self-pay | Admitting: Physician Assistant

## 2020-09-28 NOTE — Telephone Encounter (Signed)
Heather from [facility] called in and left a message. They received a call from [facility] about this patient. They do not have orders for labwork. Their phone number is [phone] and fax is [phone].

## 2020-09-28 NOTE — Telephone Encounter (Signed)
Called patient and faxed to [facility] and confirmed.

## 2020-09-29 ENCOUNTER — Other Ambulatory Visit: Payer: Self-pay | Admitting: Neurology

## 2020-10-04 LAB — COMPREHENSIVE METABOLIC PANEL
ALT: 8 IU/L (ref 0–32)
AST: 12 IU/L (ref 0–40)
Albumin/Globulin Ratio: 1.7 (ref 1.2–2.2)
Albumin: 4.4 g/dL (ref 3.8–4.8)
Alkaline Phosphatase: 86 IU/L (ref 44–121)
BUN/Creatinine Ratio: 10 — ABNORMAL LOW (ref 12–28)
BUN: 9 mg/dL (ref 8–27)
Bilirubin Total: 0.5 mg/dL (ref 0.0–1.2)
CO2: 24 mmol/L (ref 20–29)
Calcium: 9.3 mg/dL (ref 8.7–10.3)
Chloride: 102 mmol/L (ref 96–106)
Creatinine, Ser: 0.9 mg/dL (ref 0.57–1.00)
Globulin, Total: 2.6 g/dL (ref 1.5–4.5)
Glucose: 80 mg/dL (ref 65–99)
Potassium: 3.8 mmol/L (ref 3.5–5.2)
Sodium: 142 mmol/L (ref 134–144)
Total Protein: 7 g/dL (ref 6.0–8.5)
eGFR: 72 mL/min/{1.73_m2} (ref >59)

## 2020-10-04 LAB — CBC/DIFF AMBIGUOUS DEFAULT
Basophils Absolute: 0 10*3/uL (ref 0.0–0.2)
Basos: 1 %
EOS (ABSOLUTE): 0.1 10*3/uL (ref 0.0–0.4)
Eos: 2 %
Hematocrit: 40.5 % (ref 34.0–46.6)
Hemoglobin: 13.4 g/dL (ref 11.1–15.9)
Immature Grans (Abs): 0 10*3/uL (ref 0.0–0.1)
Immature Granulocytes: 0 %
Lymphocytes Absolute: 1.5 10*3/uL (ref 0.7–3.1)
Lymphs: 25 %
MCH: 26.8 pg (ref 26.6–33.0)
MCHC: 33.1 g/dL (ref 31.5–35.7)
MCV: 81 fL (ref 79–97)
Monocytes Absolute: 0.5 10*3/uL (ref 0.1–0.9)
Monocytes: 8 %
Neutrophils Absolute: 3.9 10*3/uL (ref 1.4–7.0)
Neutrophils: 64 %
Platelets: 248 10*3/uL (ref 150–450)
RBC: 5 x10E6/uL (ref 3.77–5.28)
RDW: 13.4 % (ref 11.7–15.4)
WBC: 6 10*3/uL (ref 3.4–10.8)

## 2020-10-04 LAB — SEDIMENTATION RATE: Sed Rate: 11 mm/hr (ref 0–40)

## 2020-10-04 LAB — C-REACTIVE PROTEIN: CRP: <1 mg/L (ref 0–10)

## 2020-10-04 LAB — TSH: TSH: 1.42 u[IU]/mL (ref 0.450–4.500)

## 2020-10-04 LAB — HIV-1 RNA QUANT-NO REFLEX-BLD: HIV-1 RNA Viral Load: <20 copies/mL

## 2020-10-04 LAB — ANCA PROFILE (RDL)
ANCA by IFA (RDL): NEGATIVE
Anti-MPO Ab (RDL): <20 Units (ref <20)
Anti-PR-3 Ab (RDL): <20 Units (ref <20)

## 2020-10-04 LAB — VITAMIN B12: Vitamin B-12: 479 pg/mL (ref 232–1245)

## 2020-10-04 LAB — SPECIMEN STATUS REPORT

## 2020-10-05 NOTE — Progress Notes (Signed)
Patient advised of labs.

## 2020-10-18 ENCOUNTER — Other Ambulatory Visit: Payer: Self-pay

## 2020-10-18 ENCOUNTER — Ambulatory Visit
Admission: RE | Admit: 2020-10-18 | Discharge: 2020-10-18 | Disposition: A | Discharge status: Home / Self Care | Payer: 59 | Source: Ambulatory Visit | Attending: Physician Assistant | Admitting: Physician Assistant

## 2020-10-18 IMAGING — MR MR HEAD WO/W CM
12 series · 48 of 48 positions shown · IV contrast (multihance)
Comparison: Brain MRI [DATE].

CLINICAL DATA: Memory loss.

EXAM:
MRI HEAD WITHOUT AND WITH CONTRAST
TECHNIQUE: Multiplanar, multiecho pulse sequences of the brain and surrounding
structures were obtained without and with intravenous contrast.
CONTRAST:  12mL MULTIHANCE GADOBENATE DIMEGLUMINE 529 MG/ML IV SOLN

[Series 2: T1 · sagittal · 5.0mm · 0.45mm/px · 1 of 23 slices shown]
[im 1/23]
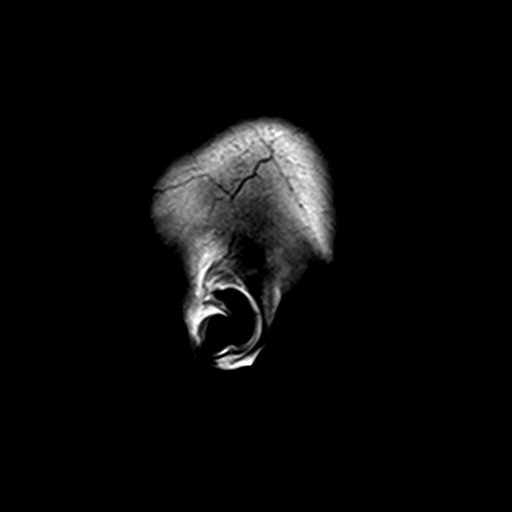

[Series 3: DWI · axial · 3.0mm · 1.80mm/px · z∈[-71,+74]mm · 7 of 100 slices shown (1 of 4)]
[im 1/100]
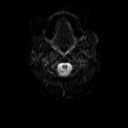
[im 17/100]
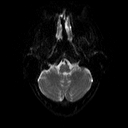
[im 34/100]
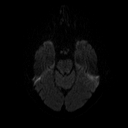
[im 50/100]
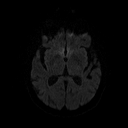
[im 67/100]
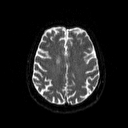
[im 83/100]
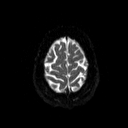
[im 100/100]
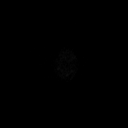

[Series 4: DWI · axial · 3.0mm · 1.80mm/px · z∈[-71,+74]mm · 3 of 45 slices shown (2 of 4)]
[im 1/45]
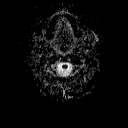
[im 23/45]
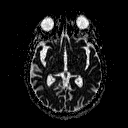
[im 45/45]
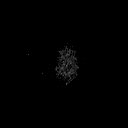

[Series 5: DWI · coronal · 5.0mm · 1.80mm/px · 5 of 67 slices shown (3 of 4)]
[im 1/67]
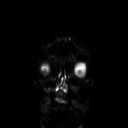
[im 17/67]
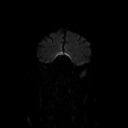
[im 34/67]
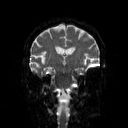
[im 50/67]
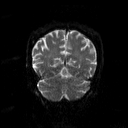
[im 67/67]
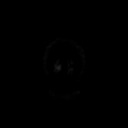

[Series 6: DWI · coronal · 5.0mm · 1.80mm/px · 2 of 34 slices shown (4 of 4)]
[im 1/34]
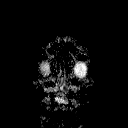
[im 34/34]
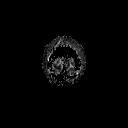

[Series 7: T2 · axial · 5.0mm · 0.60mm/px · z∈[-74,+66]mm · 2 of 22 slices shown (1 of 2)]
[im 1/22]
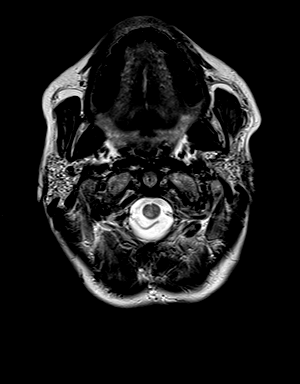
[im 22/22]
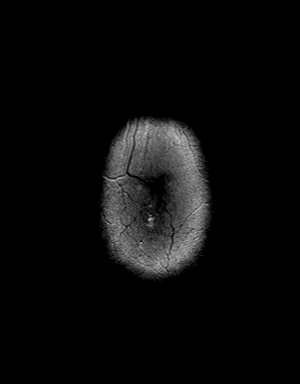

[Series 8: FLAIR · axial · 3.0mm · 0.45mm/px · z∈[-72,+74]mm · 2 of 33 slices shown]
[im 1/33]
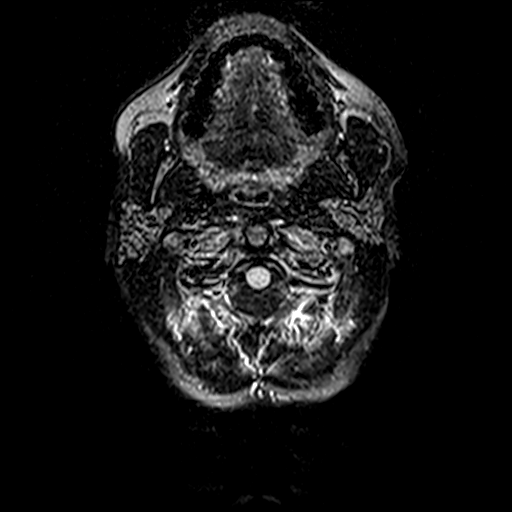
[im 33/33]
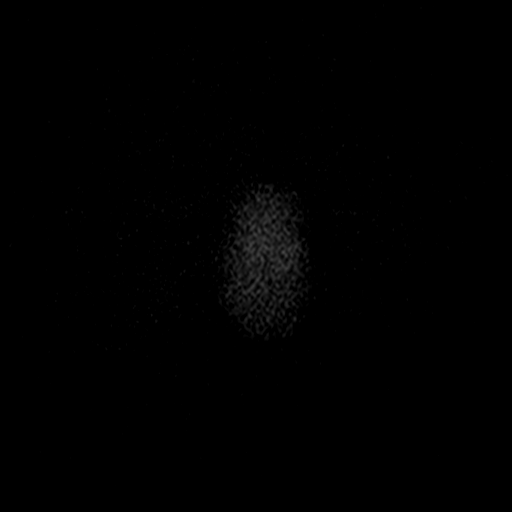

[Series 10: swi_images · axial · 4.0mm · 0.90mm/px · z∈[-68,+70]mm · 2 of 36 slices shown]
[im 1/36]
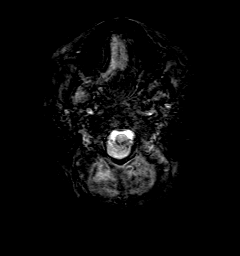
[im 36/36]
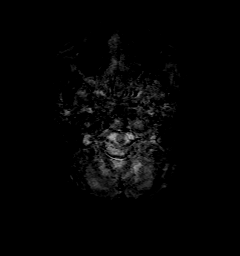

[Series 11: t1_mpr_tra · axial · 1.0mm · 0.75mm/px · z∈[-71,+70]mm · 10 of 144 slices shown]
[im 1/144]
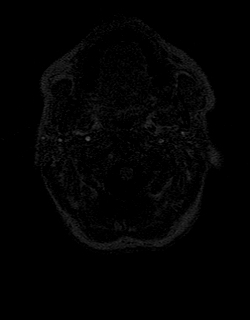
[im 16/144]
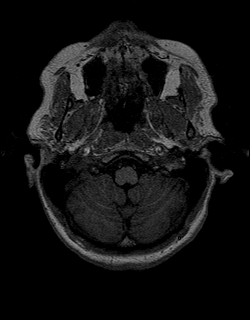
[im 32/144]
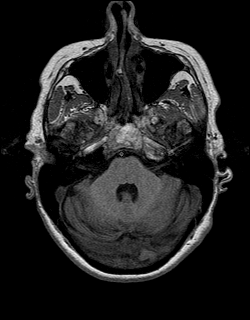
[im 48/144]
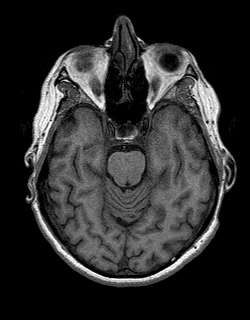
[im 64/144]
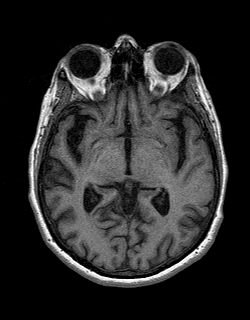
[im 80/144]
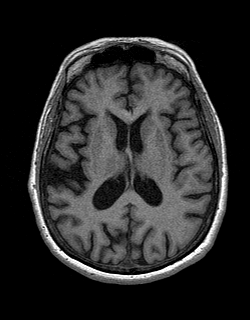
[im 96/144]
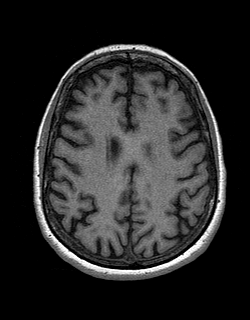
[im 112/144]
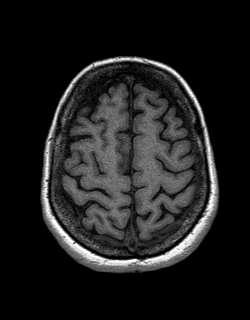
[im 128/144]
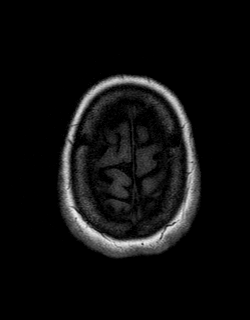
[im 144/144]
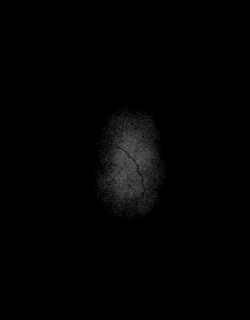

[Series 12: T2 · coronal · 5.0mm · 0.45mm/px · 2 of 27 slices shown (2 of 2)]
[im 1/27]
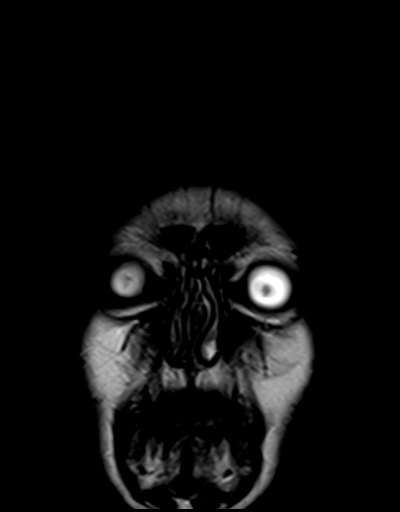
[im 27/27]
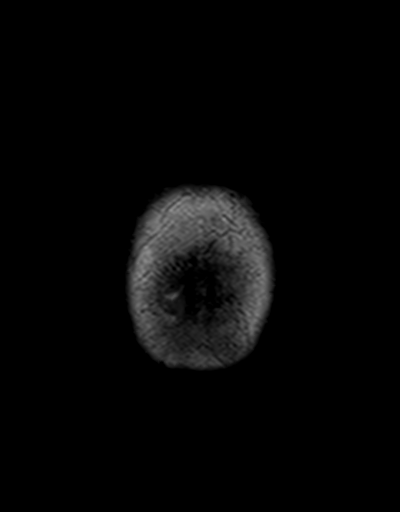

[Series 13: t1_mpr_tra post · axial · 1.0mm · 0.75mm/px · z∈[-71,+70]mm · 10 of 144 slices shown]
[im 1/144]
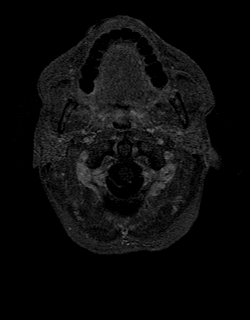
[im 16/144]
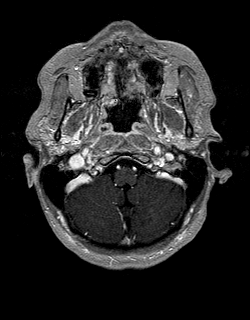
[im 32/144]
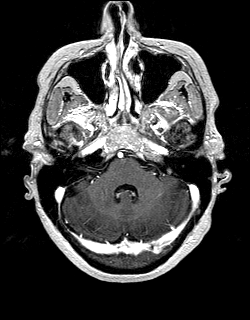
[im 48/144]
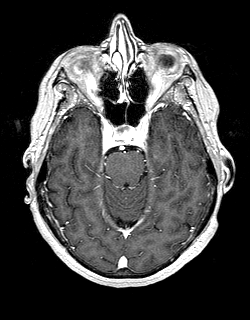
[im 64/144]
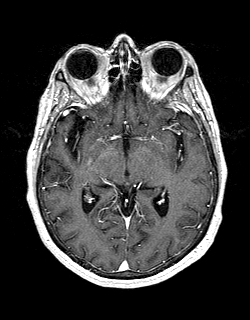
[im 80/144]
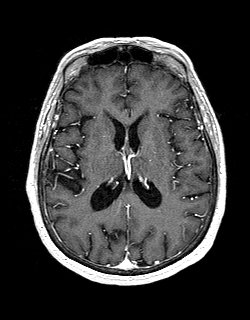
[im 96/144]
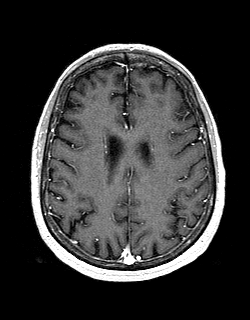
[im 112/144]
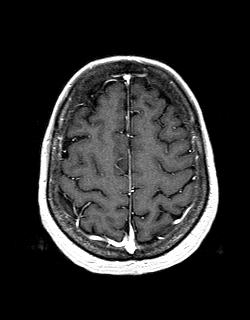
[im 128/144]
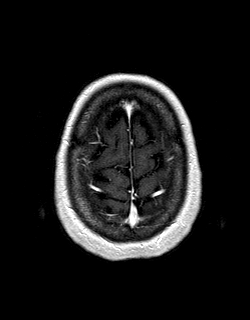
[im 144/144]
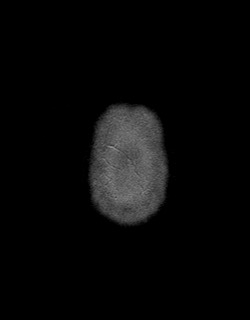

[Series 14: post cor · coronal · 5.0mm · 0.45mm/px · 2 of 27 slices shown]
[im 1/27]
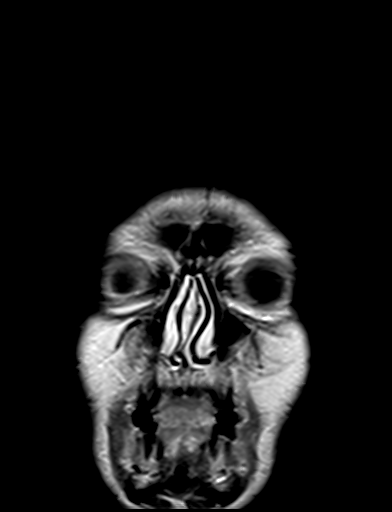
[im 27/27]
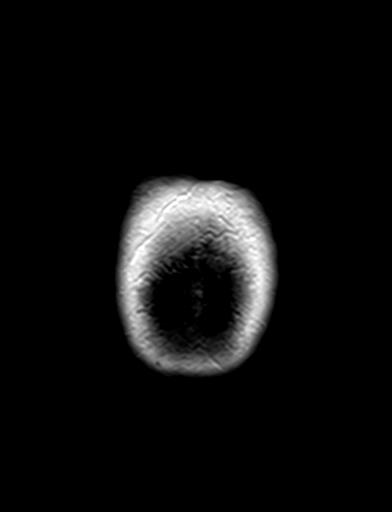

[48 of 48 positions shown; findings below may reference images not displayed]

FINDINGS: Brain:

Mild generalized cerebral and cerebellar atrophy.

Mild multifocal T2 FLAIR hyperintense signal abnormality within the
cerebral white matter, nonspecific but compatible chronic small
vessel ischemic disease.

There is no acute infarct.

No evidence of an intracranial mass.

No chronic intracranial blood products.

No extra-axial fluid collection.

No midline shift.

No pathologic intracranial enhancement identified.

Vascular: Maintained flow voids within the proximal large arterial
vessels.

Skull and upper cervical spine: No focal suspicious marrow lesion.

Sinuses/Orbits: Visualized orbits show no acute finding. Trace
mucosal thickening within the bilateral ethmoid and right sphenoid
sinuses.
IMPRESSION: No evidence of acute intracranial abnormality.

Mild chronic small vessel ischemic changes within the cerebral white
matter.

Mild generalized parenchymal atrophy.

Minimal paranasal sinus mucosal thickening, as described.

## 2020-10-18 MED ORDER — GADOBENATE DIMEGLUMINE 529 MG/ML IV SOLN
12.0000 mL | Freq: Once | INTRAVENOUS | Status: AC | PRN
  Administered 2020-10-18: 12 mL via INTRAVENOUS

## 2020-11-28 ENCOUNTER — Ambulatory Visit: Payer: 59 | Admitting: Physician Assistant

## 2020-12-22 ENCOUNTER — Other Ambulatory Visit (HOSPITAL_COMMUNITY): Payer: Self-pay | Admitting: Family Medicine

## 2020-12-22 ENCOUNTER — Other Ambulatory Visit: Payer: Self-pay | Admitting: Family Medicine

## 2020-12-22 DIAGNOSIS — R109 Unspecified abdominal pain: Secondary | ICD-10-CM

## 2020-12-29 ENCOUNTER — Ambulatory Visit (HOSPITAL_COMMUNITY)
Admission: RE | Admit: 2020-12-29 | Discharge: 2020-12-29 | Disposition: A | Discharge status: Home / Self Care | Payer: 59 | Source: Ambulatory Visit | Attending: Family Medicine | Admitting: Family Medicine

## 2020-12-29 ENCOUNTER — Other Ambulatory Visit: Payer: Self-pay

## 2020-12-29 DIAGNOSIS — R109 Unspecified abdominal pain: Secondary | ICD-10-CM | POA: Insufficient documentation

## 2020-12-29 IMAGING — US US ABDOMEN COMPLETE
1 series · 13 of 25 positions shown · non-contrast
Comparison: Ultrasound [DATE], CT [DATE]

CLINICAL DATA: Abdominal pain 1 year.

EXAM:
ABDOMEN ULTRASOUND COMPLETE

[Series 1: us abdomen complete · 0.19mm/px · 13 of 102 slices shown]
[im 1/102]
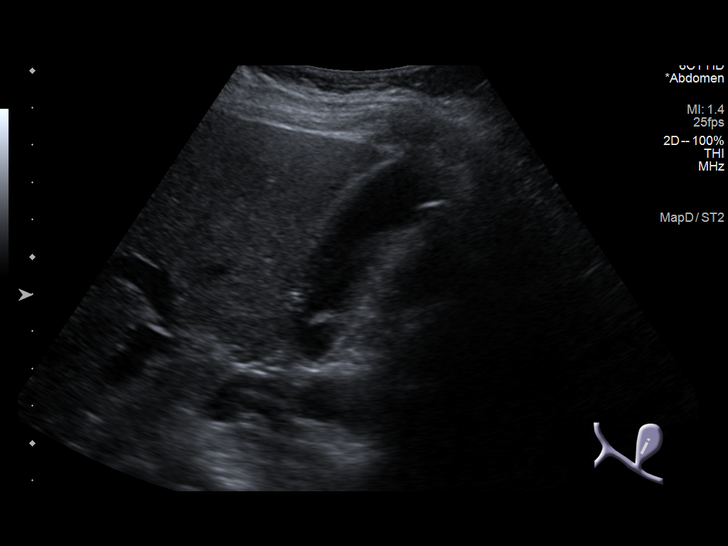
[im 9/102]
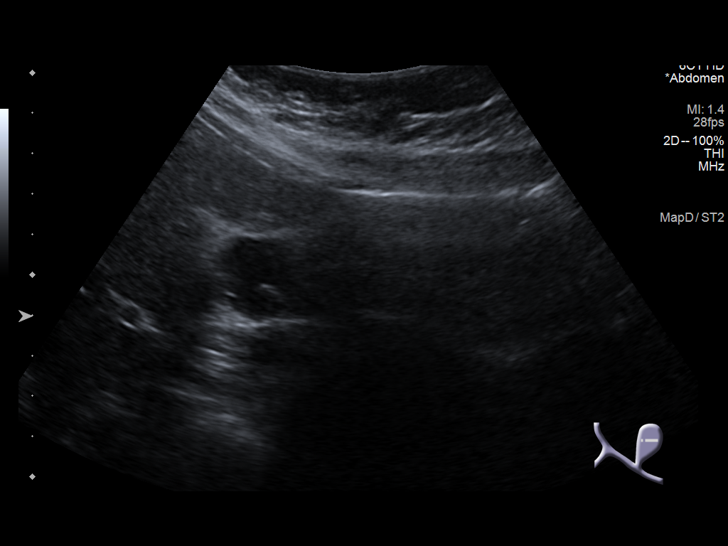
[im 17/102]
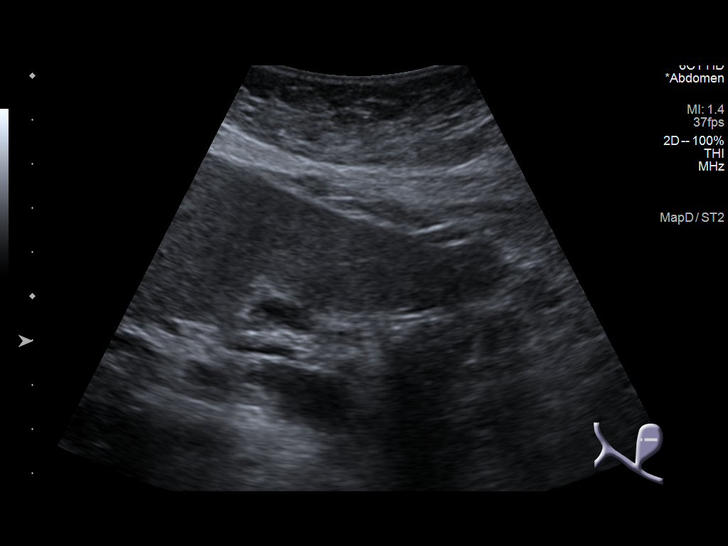
[im 26/102]
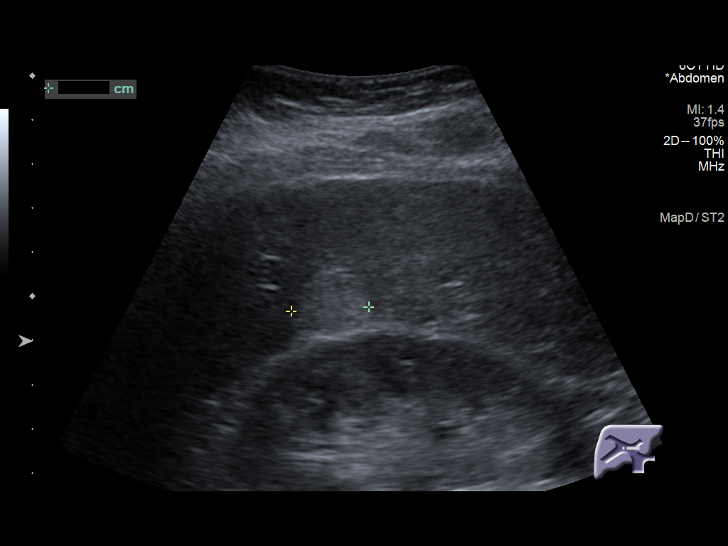
[im 34/102]
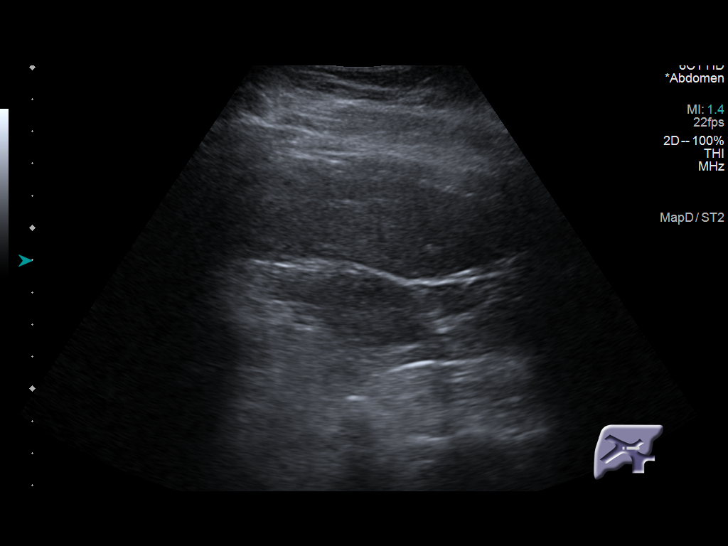
[im 43/102]
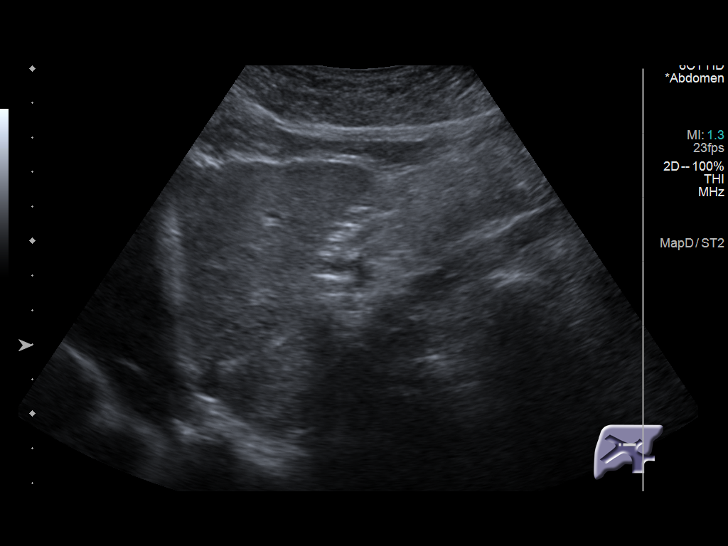
[im 51/102]
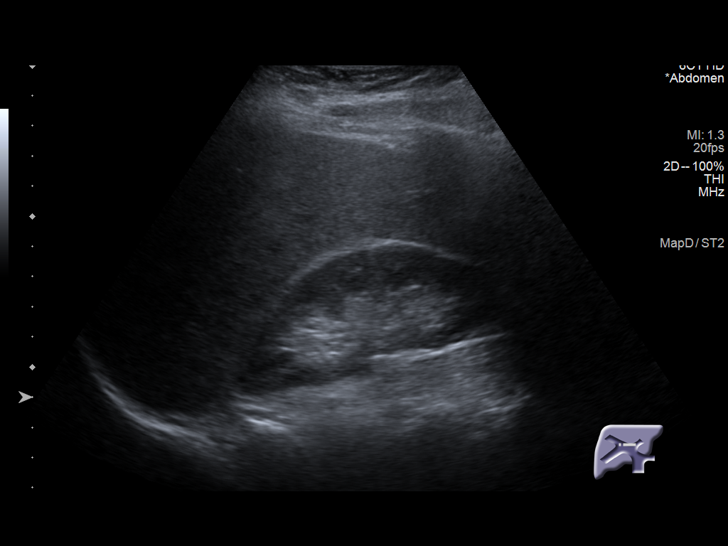
[im 59/102]
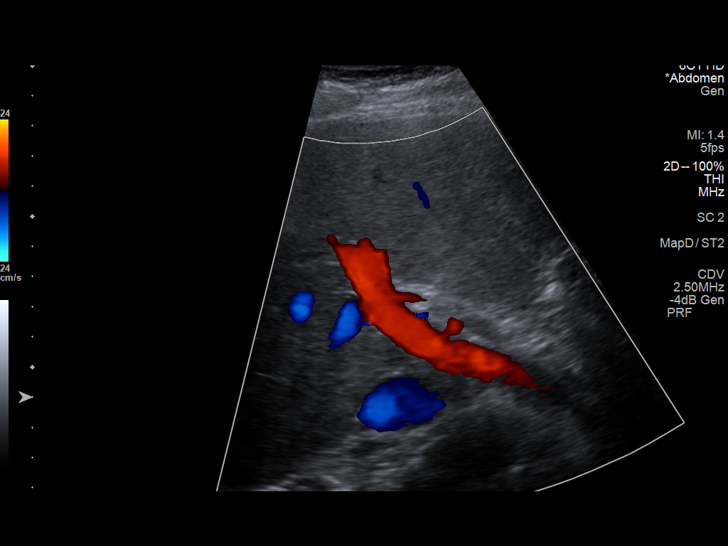
[im 68/102]
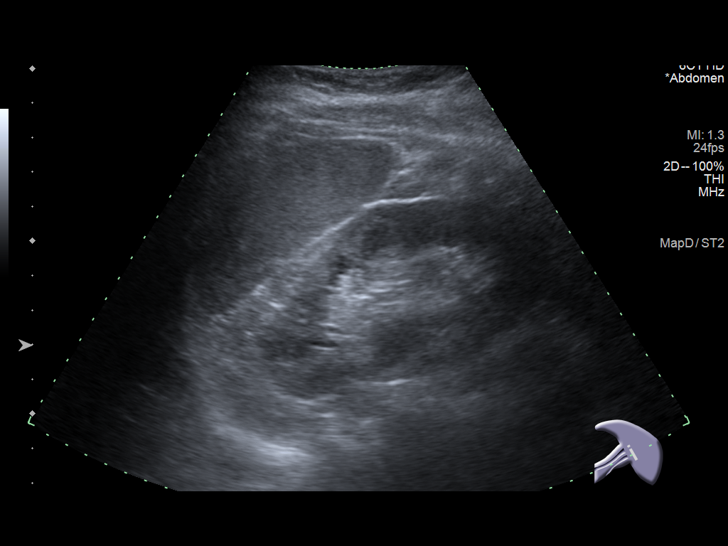
[im 76/102]
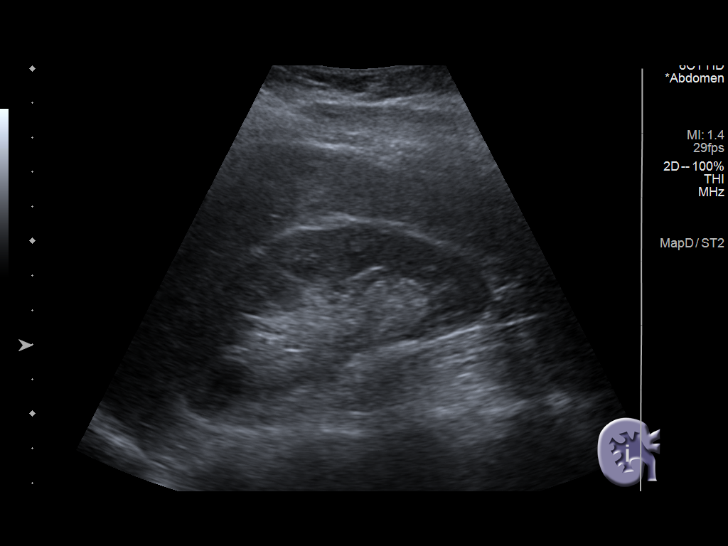
[im 85/102]
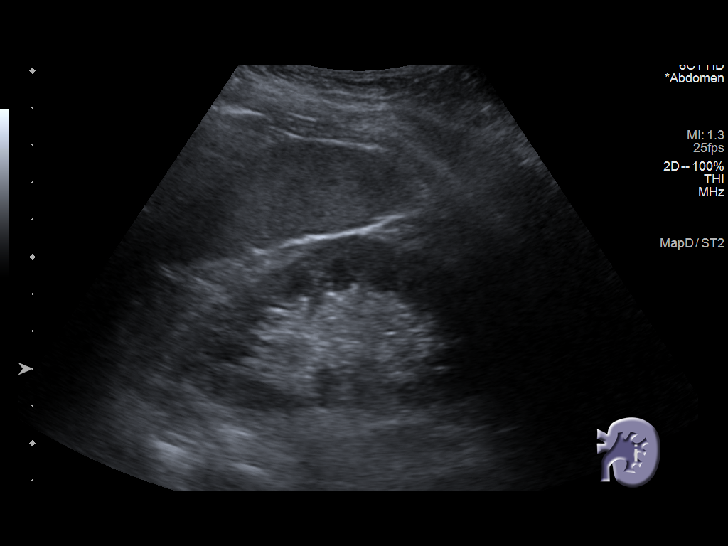
[im 93/102]
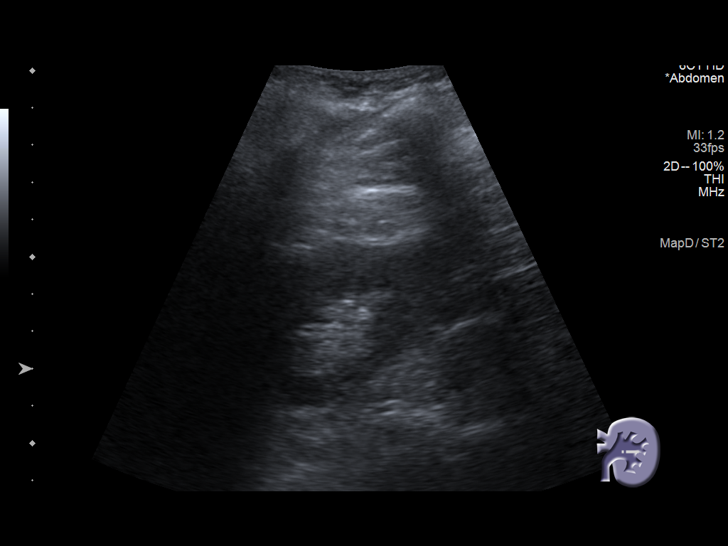
[im 102/102]
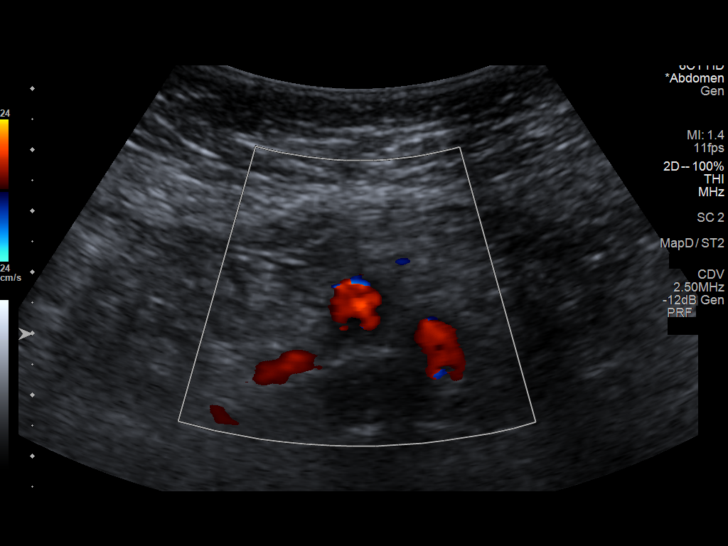

[13 of 25 positions shown; findings below may reference images not displayed]

FINDINGS: Gallbladder: Mild cholelithiasis with largest stone measuring
cm. No wall thickening. No adjacent free fluid. Negative sonographic
Murphy sign.

Common bile duct: Diameter: 3.1 mm.

Liver: Mild coarse parenchymal echogenicity compatible with a degree
of steatosis. 2 cm oval hyperechoic mass over the inferior right
lobe of the liver without significant change likely a hemangioma.
Portal vein is patent on color Doppler imaging with normal direction
of blood flow towards the liver.

IVC: No abnormality visualized.

Pancreas: Visualized portion unremarkable.

Spleen: Size and appearance within normal limits.

Right Kidney: Length: 9.5 cm. Echogenicity within normal limits. No
mass or hydronephrosis visualized.

Left Kidney: Length: 9.2 cm. Echogenicity within normal limits. No
mass or hydronephrosis visualized.

Abdominal aorta: No aneurysm visualized. Calcified atherosclerotic
plaque is present.

Other findings: No free fluid.
IMPRESSION: 1. Stable cholelithiasis. No additional sonographic evidence to
suggest cholecystitis.

2. Findings suggesting mild hepatic steatosis. Stable 2 cm
hyperechoic right liver mass likely hemangioma.

## 2021-01-11 ENCOUNTER — Other Ambulatory Visit (HOSPITAL_COMMUNITY): Payer: Self-pay | Admitting: Family Medicine

## 2021-01-11 DIAGNOSIS — R1011 Right upper quadrant pain: Secondary | ICD-10-CM

## 2021-01-18 ENCOUNTER — Other Ambulatory Visit: Payer: Self-pay

## 2021-01-18 ENCOUNTER — Ambulatory Visit (HOSPITAL_COMMUNITY)
Admission: RE | Admit: 2021-01-18 | Discharge: 2021-01-18 | Disposition: A | Discharge status: Home / Self Care | Payer: 59 | Source: Ambulatory Visit | Attending: Family Medicine | Admitting: Family Medicine

## 2021-01-18 ENCOUNTER — Encounter (HOSPITAL_COMMUNITY): Payer: Self-pay

## 2021-01-18 DIAGNOSIS — R1011 Right upper quadrant pain: Secondary | ICD-10-CM | POA: Insufficient documentation

## 2021-01-18 DIAGNOSIS — R1013 Epigastric pain: Secondary | ICD-10-CM | POA: Diagnosis not present

## 2021-01-18 MED ORDER — SINCALIDE 5 MCG IJ SOLR
INTRAMUSCULAR | Status: AC
  Administered 2021-01-18: 1.2 ug via INTRAVENOUS
  Filled 2021-01-18: qty 5

## 2021-01-18 MED ORDER — SODIUM CHLORIDE FLUSH 0.9 % IV SOLN
INTRAVENOUS | Status: AC
  Filled 2021-01-18: qty 20

## 2021-01-18 MED ORDER — STERILE WATER FOR INJECTION IJ SOLN
INTRAMUSCULAR | Status: AC
  Administered 2021-01-18: 1.2 mL via INTRAVENOUS
  Filled 2021-01-18: qty 10

## 2021-01-18 MED ORDER — TECHNETIUM TC 99M MEBROFENIN IV KIT
5.0000 | PACK | Freq: Once | INTRAVENOUS | Status: AC | PRN
  Administered 2021-01-18: 5.2 via INTRAVENOUS

## 2021-01-20 DIAGNOSIS — M3501 Sicca syndrome with keratoconjunctivitis: Secondary | ICD-10-CM | POA: Diagnosis not present

## 2021-01-20 DIAGNOSIS — R4582 Worries: Secondary | ICD-10-CM | POA: Diagnosis not present

## 2021-01-20 DIAGNOSIS — M26609 Unspecified temporomandibular joint disorder, unspecified side: Secondary | ICD-10-CM | POA: Diagnosis not present

## 2021-01-20 DIAGNOSIS — E7849 Other hyperlipidemia: Secondary | ICD-10-CM | POA: Diagnosis not present

## 2021-01-20 DIAGNOSIS — R413 Other amnesia: Secondary | ICD-10-CM | POA: Diagnosis not present

## 2021-01-20 DIAGNOSIS — D500 Iron deficiency anemia secondary to blood loss (chronic): Secondary | ICD-10-CM | POA: Diagnosis not present

## 2021-01-20 DIAGNOSIS — D510 Vitamin B12 deficiency anemia due to intrinsic factor deficiency: Secondary | ICD-10-CM | POA: Diagnosis not present

## 2021-01-20 DIAGNOSIS — I10 Essential (primary) hypertension: Secondary | ICD-10-CM | POA: Diagnosis not present

## 2021-01-20 DIAGNOSIS — R69 Illness, unspecified: Secondary | ICD-10-CM | POA: Diagnosis not present

## 2021-02-06 DIAGNOSIS — E538 Deficiency of other specified B group vitamins: Secondary | ICD-10-CM | POA: Diagnosis not present

## 2021-02-08 ENCOUNTER — Other Ambulatory Visit: Payer: Self-pay

## 2021-02-08 ENCOUNTER — Ambulatory Visit: Payer: 59 | Admitting: General Surgery

## 2021-02-08 ENCOUNTER — Encounter: Payer: Self-pay | Admitting: General Surgery

## 2021-02-08 VITALS — BP 144/88 | HR 79 | Temp 97.8°F | Resp 14 | Ht 61.5 in | Wt 125.0 lb

## 2021-02-08 DIAGNOSIS — K8020 Calculus of gallbladder without cholecystitis without obstruction: Secondary | ICD-10-CM | POA: Diagnosis not present

## 2021-02-08 NOTE — Patient Instructions (Signed)
Minimally Invasive Cholecystectomy Minimally invasive cholecystectomy is surgery to remove the gallbladder. The gallbladder is a pear-shaped organ that lies beneath the liver on the right side of the body. The gallbladder stores bile, which is a fluid that helps the body digest fats. Cholecystectomy is often done to treat inflammation (irritation and swelling) of the gallbladder (cholecystitis). This condition is usually caused by a buildup of gallstones (cholelithiasis) in the gallbladder or when the fluid in the gall bladder becomes stagnant because gallstones get stuck in the ducts (tubes) and block the flow of bile. This can result in inflammation and pain. In severe cases, emergency surgery may be required. This procedure is done through small incisions in the abdomen, instead of one large incision. It is also called laparoscopic surgery. A thin scope with a camera (laparoscope) is inserted through one incision. Then surgical instruments are inserted through the other incisions. In some cases, a minimally invasive surgery may need to be changed to a surgery that is done through a larger incision. This is called open surgery. Tell a health care provider about: Any allergies you have. All medicines you are taking, including vitamins, herbs, eye drops, creams, and over-the-counter medicines. Any problems you or family members have had with anesthetic medicines. Any bleeding problems you have. Any surgeries you have had. Any medical conditions you have. Whether you are pregnant or may be pregnant. What are the risks? Generally, this is a safe procedure. However, problems may occur, including: Infection. Bleeding. Allergic reactions to medicines. Damage to nearby structures or organs. A gallstone remaining in the common bile duct. The common bile duct carries bile from the gallbladder to the small intestine. A bile leak from the liver or cystic duct after your gallbladder is removed. What happens  during the procedure?  An IV will be inserted into one of your veins. You will be given one or both of the following: A medicine to help you relax (sedative). A medicine to make you fall asleep (general anesthetic). Your surgeon will make several small incisions in your abdomen. The laparoscope will be inserted through one of the small incisions. The camera on the laparoscope will send images to a monitor in the operating room. This lets your surgeon see inside your abdomen. A gas will be pumped into your abdomen. This will expand your abdomen to give the surgeon more room to perform the surgery. Other tools that are needed for the procedure will be inserted through the other incisions. The gallbladder will be removed through one of the incisions. Your common bile duct may be examined. If stones are found in the common bile duct, they may be removed. After your gallbladder has been removed, the incisions will be closed with stitches (sutures), staples, or skin glue. Your incisions will be covered with a bandage (dressing). The procedure may vary among health care providers and hospitals. What happens after the procedure? Your blood pressure, heart rate, breathing rate, and blood oxygen level will be monitored until you leave the hospital or clinic. You will be given medicines as needed to control your pain. You may have a drain placed in the incision. The drain will be removed a day or two after the procedure. Summary Minimally invasive cholecystectomy, also called laparoscopic cholecystectomy, is surgery to remove the gallbladder using small incisions. Tell your health care provider about all the medical conditions you have and all the medicines you are taking for those conditions. Before the procedure, follow instructions about when to stop eating and  drinking and changing or stopping medicines. Plan to have a responsible adult care for you for the time you are told after you leave the  hospital or clinic. This information is not intended to replace advice given to you by your health care provider. Make sure you discuss any questions you have with your health care provider. Document Revised: 06/28/2020 Document Reviewed: 06/28/2020 Elsevier Patient Education  2022 [place]. Cholelithiasis Cholelithiasis is a disease in which gallstones form in the gallbladder. The gallbladder is an organ that stores bile. Bile is a fluid that helps to digest fats. Gallstones begin as small crystals and can slowly grow into stones. They may cause no symptoms until they block the gallbladder duct, or cystic duct, when the gallbladder tightens (contracts) after food is eaten. This can cause pain and is known as a gallbladder attack, or biliary colic. There are two main types of gallstones: Cholesterol stones. These are the most common type of gallstone. These stones are made of hardened cholesterol and are usually yellow-green in color. Cholesterol is a fat-like substance that is made in the liver. Pigment stones. These are dark in color and are made of a red-yellow substance, called bilirubin,that forms when hemoglobin from red blood cells breaks down. What are the causes? This condition may be caused by an imbalance in the different parts that make bile. This can happen if the bile: Has too much bilirubin. This can happen in certain blood diseases, such as sickle cell anemia. Has too much cholesterol. Does not have enough bile salts. These salts help the body absorb and digest fats. In some cases, this condition can also be caused by the gallbladder not emptying completely or often enough. This is common during pregnancy. What increases the risk? The following factors may make you more likely to develop this condition: Being female. Having multiple pregnancies. Health care providers sometimes advise removing diseased gallbladders before future pregnancies. Eating a diet that is heavy in fried  foods, fat, and refined carbohydrates, such as white bread and white rice. Being obese. Being older than age 63. Using medicines that contain female hormones (estrogen) for a long time. Losing weight quickly. Having a family history of gallstones. Having certain medical problems, such as: Diabetes mellitus. Cystic fibrosis. Crohn's disease. Cirrhosis or other long-term (chronic) liver disease. Certain blood diseases, such as sickle cell anemia or leukemia. What are the signs or symptoms? In many cases, having gallstones causes no symptoms. When you have gallstones but do not have symptoms, you have silent gallstones. If a gallstone blocks your bile duct, it can cause a gallbladder attack. The main symptom of a gallbladder attack is sudden pain in the upper right part of the abdomen. The pain: Usually comes at night or after eating. Can last for one hour or more. Can spread to your right shoulder, back, or chest. Can feel like indigestion. This is discomfort, burning, or fullness in your upper abdomen. If the bile duct is blocked for more than a few hours, it can cause an infection or inflammation of your gallbladder (cholecystitis), liver, or pancreas. This can cause: Nausea or vomiting. Bloating. Pain in your abdomen that lasts for 5 hours or longer. Tenderness in your upper abdomen, often in the upper right section and under your rib cage. Fever or chills. Skin or the white parts of your eyes turning yellow (jaundice). This usually happens when a stone has blocked bile from passing through the common bile duct. Dark urine or light-colored stools. How is  this diagnosed? This condition may be diagnosed based on: A physical exam. Your medical history. Ultrasound. CT scan. MRI. You may also have other tests, including: Blood tests to check for signs of an infection or inflammation. Cholescintigraphy, or HIDA scan. This is a scan of your gallbladder and bile ducts (biliary system)  using non-harmful radioactive material and special cameras that can see the radioactive material. Endoscopic retrograde cholangiopancreatogram. This involves inserting a small tube with a camera on the end (endoscope) through your mouth to look at bile ducts and check for blockages. How is this treated? Treatment for this condition depends on the severity of the condition. Silent gallstones do not need treatment. Treatment may be needed if a blockage causes a gallbladder attack or other symptoms. Treatment may include: Home care, if symptoms are not severe. During a simple gallbladder attack, stop eating and drinking for 12-24 hours (except for water and clear liquids). This helps to "cool down" your gallbladder. After 1 or 2 days, you can start to eat a diet of simple or clear foods, such as broths and crackers. You may also need medicines for pain or nausea or both. If you have cholecystitis and an infection, you will need antibiotics. A hospital stay, if needed for pain control or for cholecystitis with severe infection. Cholecystectomy, or surgery to remove your gallbladder. This is the most common treatment if all other treatments have not worked. Medicines to break up gallstones. These are most effective at treating small gallstones. Medicines may be used for up to 6-12 months. Endoscopic retrograde cholangiopancreatogram. A small basket can be attached to the endoscope and used to capture and remove gallstones, mainly those that are in the common bile duct. Follow these instructions at home: Medicines Take over-the-counter and prescription medicines only as told by your health care provider. If you were prescribed an antibiotic medicine, take it as told by your health care provider. Do not stop taking the antibiotic even if you start to feel better. Ask your health care provider if the medicine prescribed to you requires you to avoid driving or using machinery. Eating and drinking Drink  enough fluid to keep your urine pale yellow. This is important during a gallbladder attack. Water and clear liquids are preferred. Follow a healthy diet. This includes: Reducing fatty foods, such as fried food and foods high in cholesterol. Reducing refined carbohydrates, such as white bread and white rice. Eating more fiber. Aim for foods such as almonds, fruit, and beans. Alcohol use If you drink alcohol: Limit how much you use to: 0-1 drink a day for nonpregnant women. 0-2 drinks a day for men. Be aware of how much alcohol is in your drink. In the [place] one drink equals one 12 oz bottle of beer (355 mL), one 5 oz glass of wine (148 mL), or one 1 oz glass of hard liquor (44 mL). General instructions Do not use any products that contain nicotine or tobacco, such as cigarettes, e-cigarettes, and chewing tobacco. If you need help quitting, ask your health care provider. Maintain a healthy weight. Keep all follow-up visits as told by your health care provider. These may include consultations with a surgeon or specialist. This is important. Where to find more information [employer] of Diabetes and Digestive and Kidney Diseases: [email or web address] Contact a health care provider if: You think you have had a gallbladder attack. You have been diagnosed with silent gallstones and you develop pain in your abdomen or indigestion. You begin to have attacks more  often. You have dark urine or light-colored stools. Get help right away if: You have pain from a gallbladder attack that lasts for more than 2 hours. You have pain in your abdomen that lasts for more than 5 hours or is getting worse. You have a fever or chills. You have nausea and vomiting that do not go away. You develop jaundice. Summary Cholelithiasis is a disease in which gallstones form in the gallbladder. This condition may be caused by an imbalance in the different parts that make bile. This can happen if your bile has too  much bilirubin or cholesterol, or does not have enough bile salts. Treatment for gallstones depends on the severity of the condition. Silent gallstones do not need treatment. If gallstones cause a gallbladder attack or other symptoms, treatment usually involves not eating or drinking anything. Treatment may also include pain medicines and antibiotics, and it sometimes includes a hospital stay. Surgery to remove the gallbladder is common if all other treatments have not worked. This information is not intended to replace advice given to you by your health care provider. Make sure you discuss any questions you have with your health care provider. Document Revised: 11/17/2018 Document Reviewed: 11/17/2018 Elsevier Patient Education  2022 [employer].

## 2021-02-08 NOTE — Progress Notes (Signed)
[facility] History and Physical  Reason for Referral: Gallstones  Referring Physician: Richardean Chimera, MD   Chief Complaint   Follow-up     Jeanette Suarez is a 66 y.o. female.  HPI: Jeanette Suarez is a 66 yo who comes in with some abdominal pain and weight loss for about the last year. She says she has RUQ and epigastric pain and that she does not eat because of this. She also has some associated bloating and nausea.  She has had EGD and colonoscopy in the past in 2019. She has never been told she has any IBS. She has some constipation and diarrhea but more diarrhea. She has lost about 25 lbs in the last 6 months. She denies any blood in her stool.   She has no cardiac history, she has not chest pain or SOB.   Past Medical History:  Diagnosis Date   Anemia    Anxiety    Depression    Family hx of colon cancer    GERD (gastroesophageal reflux disease)    Headache    History of hiatal hernia    Hypertension    Sjogren's disease (HCC)     Past Surgical History:  Procedure Laterality Date   ABDOMINAL HYSTERECTOMY     BIOPSY  07/22/2017   Procedure: BIOPSY;  Surgeon: Malissa Hippo, MD;  Location: [facility];  Service: Endoscopy;;  gastric polyp   BUNIONECTOMY Bilateral 1992   COLONOSCOPY N/A 07/22/2017   Procedure: COLONOSCOPY;  Surgeon: Malissa Hippo, MD;  Location: [facility];  Service: Endoscopy;  Laterality: N/A;   ESOPHAGOGASTRODUODENOSCOPY N/A 07/22/2017   Procedure: ESOPHAGOGASTRODUODENOSCOPY (EGD);  Surgeon: Malissa Hippo, MD;  Location: [facility];  Service: Endoscopy;  Laterality: N/A;  7:30   eye Bilateral    lasix    Family History  Problem Relation Age of Onset   Hypertension Mother    Colon cancer Sister     Social History   Tobacco Use   Smoking status: Never   Smokeless tobacco: Never  Vaping Use   Vaping Use: Never used  Substance Use Topics   Alcohol use: No   Drug use: No    Medications: I have reviewed the  patient's current medications. Allergies as of 02/08/2021       Reactions   Ace Inhibitors Cough   Sulfa Antibiotics Swelling, Rash, Cough   Swelling to hands and lips.        Medication List        Accurate as of February 08, 2021  2:47 PM. If you have any questions, ask your nurse or doctor.          ALPRAZolam 0.5 MG tablet Commonly known as: XANAX Take 0.25 mg by mouth 2 (two) times daily as needed.   buPROPion 150 MG 24 hr tablet Commonly known as: WELLBUTRIN XL Take 150 mg by mouth daily.   cevimeline 30 MG capsule Commonly known as: EVOXAC Take 30 mg by mouth 2 (two) times daily.   Dotti 0.075 MG/24HR Generic drug: estradiol Place 1 patch onto the skin every 14 (fourteen) days.   GenTeal Tears 0.1-0.2-0.3 % Soln Apply 1 drop to eye daily as needed (both eyes for dry eyes).   iron polysaccharides 150 MG capsule Commonly known as: NIFEREX Take 1 capsule by mouth daily.   pantoprazole 40 MG tablet Commonly known as: PROTONIX Take 40 mg by mouth daily.   rosuvastatin 5 MG tablet Commonly known as:  CRESTOR Take 5 mg by mouth daily.   sertraline 100 MG tablet Commonly known as: ZOLOFT Take 200 mg by mouth daily.         ROS:  A comprehensive review of systems was negative except for: Gastrointestinal: positive for abdominal pain, nausea, and reflux symptoms Genitourinary: positive for frequency and retention  Blood pressure (!) 144/88, pulse 79, temperature 97.8 F (36.6 C), temperature source Oral, resp. rate 14, height 5' 1.5" (1.562 m), weight 125 lb (56.7 kg), SpO2 98 %. Physical Exam Vitals reviewed.  Constitutional:      Appearance: Normal appearance.  HENT:     Head: Normocephalic.     Nose: Nose normal.     Mouth/Throat:     Mouth: Mucous membranes are moist.  Eyes:     Extraocular Movements: Extraocular movements intact.  Cardiovascular:     Rate and Rhythm: Normal rate and regular rhythm.  Pulmonary:     Effort: Pulmonary effort  is normal.     Breath sounds: Normal breath sounds.  Abdominal:     General: There is no distension.     Palpations: Abdomen is soft.     Tenderness: There is abdominal tenderness in the right upper quadrant and epigastric area.  Musculoskeletal:        General: Normal range of motion.     Cervical back: Normal range of motion.  Skin:    General: Skin is warm.  Neurological:     General: No focal deficit present.     Mental Status: She is alert and oriented to person, place, and time.  Psychiatric:        Mood and Affect: Mood normal.    Results: CLINICAL DATA:  Epigastric abdominal pain, worse postprandial   EXAM: NUCLEAR MEDICINE HEPATOBILIARY IMAGING WITH GALLBLADDER EF   TECHNIQUE: Sequential images of the abdomen were obtained out to 60 minutes following intravenous administration of radiopharmaceutical. After slow intravenous infusion of 1.2 micrograms Cholecystokinin, gallbladder ejection fraction was determined.   RADIOPHARMACEUTICALS:  5.2 mCi [id number] Choletec IV   COMPARISON:  Ultrasound December 29, 2020   FINDINGS: Prompt uptake and biliary excretion of activity by the liver is seen. Gallbladder activity is visualized, consistent with patency of cystic duct. Biliary activity passes into small bowel, consistent with patent common bile duct.   Calculated gallbladder ejection fraction is 42%. (At 60 min, normal ejection fraction is greater than 40%.)   IMPRESSION: 1.  Gallbladder ejection fraction lower limits of normal.   2.  Patent cystic and common bile ducts.     Electronically Signed   By: Maudry MayhewJeffrey  Waltz M.D.   On: 01/18/2021 15:46  CLINICAL DATA:  Abdominal pain 1 year.   EXAM: ABDOMEN ULTRASOUND COMPLETE   COMPARISON:  Ultrasound 01/15/2020, CT 01/02/2020   FINDINGS: Gallbladder: Mild cholelithiasis with largest stone measuring 1.1 cm. No wall thickening. No adjacent free fluid. Negative sonographic Murphy sign.   Common bile duct:  Diameter: 3.1 mm.   Liver: Mild coarse parenchymal echogenicity compatible with a degree of steatosis. 2 cm oval hyperechoic mass over the inferior right lobe of the liver without significant change likely a hemangioma. Portal vein is patent on color Doppler imaging with normal direction of blood flow towards the liver.   IVC: No abnormality visualized.   Pancreas: Visualized portion unremarkable.   Spleen: Size and appearance within normal limits.   Right Kidney: Length: 9.5 cm. Echogenicity within normal limits. No mass or hydronephrosis visualized.   Left Kidney: Length: 9.2 cm.  Echogenicity within normal limits. No mass or hydronephrosis visualized.   Abdominal aorta: No aneurysm visualized. Calcified atherosclerotic plaque is present.   Other findings: No free fluid.   IMPRESSION: 1. Stable cholelithiasis. No additional sonographic evidence to suggest cholecystitis.   2. Findings suggesting mild hepatic steatosis. Stable 2 cm hyperechoic right liver mass likely hemangioma.     Electronically Signed   By: Elberta Fortis M.D.   On: 12/29/2020 16:49  Assessment & Plan:  LENIA HOUSLEY is a 66 y.o. female with gallstones and borderline gallbladder activity. She is tender in the RUQ and has pain with food. I told her I cannot say that all her of symptoms are from the gallbladder and that the weight loss and changes in stool could be from something else.  -PLAN: I counseled the patient about the indication, risks and benefits of laparoscopic cholecystectomy.  She understands there is a very small chance for bleeding, infection, injury to normal structures (including common bile duct), conversion to open surgery, persistent symptoms, evolution of postcholecystectomy diarrhea, need for secondary interventions, anesthesia reaction, cardiopulmonary issues and other risks not specifically detailed here. I described the expected recovery, the plan for follow-up and the restrictions  during the recovery phase.  All questions were answered.    All questions were answered to the satisfaction of the patient and family.   Lucretia Roers 02/08/2021, 2:47 PM

## 2021-02-09 ENCOUNTER — Encounter (HOSPITAL_COMMUNITY)
Admission: RE | Admit: 2021-02-09 | Discharge: 2021-02-09 | Disposition: A | Discharge status: Home / Self Care | Payer: 59 | Source: Ambulatory Visit | Attending: General Surgery | Admitting: General Surgery

## 2021-02-09 ENCOUNTER — Encounter (HOSPITAL_COMMUNITY): Payer: Self-pay

## 2021-02-09 VITALS — BP 157/59 | HR 86 | Temp 97.8°F | Resp 16 | Ht 61.05 in | Wt 125.0 lb

## 2021-02-09 DIAGNOSIS — Z01818 Encounter for other preprocedural examination: Secondary | ICD-10-CM | POA: Insufficient documentation

## 2021-02-09 DIAGNOSIS — Z862 Personal history of diseases of the blood and blood-forming organs and certain disorders involving the immune mechanism: Secondary | ICD-10-CM | POA: Diagnosis not present

## 2021-02-09 DIAGNOSIS — Z8639 Personal history of other endocrine, nutritional and metabolic disease: Secondary | ICD-10-CM | POA: Diagnosis not present

## 2021-02-09 LAB — CBC WITH DIFFERENTIAL/PLATELET
Abs Immature Granulocytes: 0.01 10*3/uL (ref 0.00–0.07)
Basophils Absolute: 0 10*3/uL (ref 0.0–0.1)
Basophils Relative: 0 %
Eosinophils Absolute: 0 10*3/uL (ref 0.0–0.5)
Eosinophils Relative: 0 %
HCT: 42 % (ref 36.0–46.0)
Hemoglobin: 13.4 g/dL (ref 12.0–15.0)
Immature Granulocytes: 0 %
Lymphocytes Relative: 19 %
Lymphs Abs: 1.1 10*3/uL (ref 0.7–4.0)
MCH: 26.6 pg (ref 26.0–34.0)
MCHC: 31.9 g/dL (ref 30.0–36.0)
MCV: 83.3 fL (ref 80.0–100.0)
Monocytes Absolute: 0.3 10*3/uL (ref 0.1–1.0)
Monocytes Relative: 5 %
Neutro Abs: 4.5 10*3/uL (ref 1.7–7.7)
Neutrophils Relative %: 76 %
Platelets: 273 10*3/uL (ref 150–400)
RBC: 5.04 MIL/uL (ref 3.87–5.11)
RDW: 13.4 % (ref 11.5–15.5)
WBC: 5.9 10*3/uL (ref 4.0–10.5)
nRBC: 0 % (ref 0.0–0.2)

## 2021-02-09 LAB — BASIC METABOLIC PANEL
Anion gap: 8 (ref 5–15)
BUN: 10 mg/dL (ref 8–23)
CO2: 27 mmol/L (ref 22–32)
Calcium: 9.1 mg/dL (ref 8.9–10.3)
Chloride: 104 mmol/L (ref 98–111)
Creatinine, Ser: 0.78 mg/dL (ref 0.44–1.00)
GFR, Estimated: >60 mL/min (ref >60)
Glucose, Bld: 91 mg/dL (ref 70–99)
Potassium: 3.4 mmol/L — ABNORMAL LOW (ref 3.5–5.1)
Sodium: 139 mmol/L (ref 135–145)

## 2021-02-09 NOTE — Patient Instructions (Addendum)
Jeanette Suarez  02/09/2021     @PREFPERIOPPHARMACY @   Your procedure is scheduled on  02/13/2021.   Report to 04/13/2021 at  0750 A.M.   Call this number if you have problems the morning of surgery:  [phone]   Remember:  Do not eat or drink after midnight.      Take these medicines the morning of surgery with A SIP OF WATER            Xanax(if needed), wellbutrin, evoxac, protonix, zoloft.     Do not wear jewelry, make-up or nail polish.  Do not wear lotions, powders, or perfumes, or deodorant.  Do not shave 48 hours prior to surgery.  Men may shave face and neck.  Do not bring valuables to the hospital.  [facility] is not responsible for any belongings or valuables.  Contacts, dentures or bridgework may not be worn into surgery.  Leave your suitcase in the car.  After surgery it may be brought to your room.  For patients admitted to the hospital, discharge time will be determined by your treatment team.  Patients discharged the day of surgery will not be allowed to drive home and must have someone with them for 24 hours.    Special instructions:   DO NOT smoke tobacco or vape for 24 hours before your procedure.  Please read over the following fact sheets that you were given. Coughing and Deep Breathing, Surgical Site Infection Prevention, Anesthesia Post-op Instructions, and Care and Recovery After Surgery      Minimally Invasive Cholecystectomy, Care After The following information offers guidance on how to care for yourself after your procedure. Your health care provider may also give you more specific instructions. If you have problems or questions, contact your health care provider. What can I expect after the procedure? After the procedure, it is common to have: Pain at your incision sites. You will be given medicines to control this pain. Mild nausea or vomiting. Bloating and possible shoulder pain from the gas that was used during the  procedure. Follow these instructions at home: Medicines Take over-the-counter and prescription medicines only as told by your health care provider. If you were prescribed an antibiotic medicine, take it as told by your health care provider. Do not stop using the antibiotic even if you start to feel better. Ask your health care provider if the medicine prescribed to you: Requires you to avoid driving or using machinery. Can cause constipation. You may need to take these actions to prevent or treat constipation: Drink enough fluid to keep your urine pale yellow. Take over-the-counter or prescription medicines. Eat foods that are high in fiber, such as beans, whole grains, and fresh fruits and vegetables. Limit foods that are high in fat and processed sugars, such as fried or sweet foods. Incision care  Follow instructions from your health care provider about how to take care of your incisions. Make sure you: Wash your hands with soap and water for at least 20 seconds before and after you change your bandage (dressing). If soap and water are not available, use hand sanitizer. Change your dressing as told by your health care provider. Leave stitches (sutures), skin glue, or adhesive strips in place. These skin closures may need to be in place for 2 weeks or longer. If adhesive strip edges start to loosen and curl up, you may trim the loose edges. Do not remove adhesive strips completely unless your  health care provider tells you to do that. Do not take baths, swim, or use a hot tub until your health care provider approves. Ask your health care provider if you may take showers. You may only be allowed to take sponge baths. Check your incision area every day for signs of infection. Check for: More redness, swelling, or pain. Fluid or blood. Warmth. Pus or a bad smell. Activity Rest as told by your health care provider. Do not do activities that require a lot of effort. Avoid sitting for a long  time without moving. Get up to take short walks every 1-2 hours. This is important to improve blood flow and breathing. Ask for help if you feel weak or unsteady. Do not lift anything that is heavier than 10 lb (4.5 kg), or the limit that you are told, until your health care provider says that it is safe. Do not play contact sports until your health care provider approves. Do not return to work or school until your health care provider approves. Return to your normal activities as told by your health care provider. Ask your health care provider what activities are safe for you. General instructions If you were given a sedative during the procedure, it can affect you for several hours. Do not drive or operate machinery until your health care provider says that it is safe. Keep all follow-up visits. This is important. Contact a health care provider if: You develop a rash. You have more redness, swelling, or pain around your incisions. You have fluid or blood coming from your incisions. Your incisions feel warm to the touch. You have pus or a bad smell coming from your incisions. You have a fever. One or more of your incisions breaks open. Get help right away if: You have trouble breathing. You have chest pain. You have more pain in your shoulders. You faint or feel dizzy when you stand. You have severe pain in your abdomen. You have nausea or vomiting that lasts for more than one day. You have leg pain that is new or unusual, or if it is localized to one specific spot. These symptoms may represent a serious problem that is an emergency. Do not wait to see if the symptoms will go away. Get medical help right away. Call your local emergency services (911 in the [place] Do not drive yourself to the hospital. Summary After your procedure, it is common to have pain at the incision sites. You may also have nausea or bloating. Follow your health care provider's instructions about medicine, activity  restrictions, and caring for your incision areas. Do not do activities that require a lot of effort. Contact a health care provider if you have a fever or other signs of infection, such as more redness, swelling, or pain around the incisions. Get help right away if you have chest pain, increasing pain in the shoulders, or trouble breathing. This information is not intended to replace advice given to you by your health care provider. Make sure you discuss any questions you have with your health care provider. Document Revised: 06/28/2020 Document Reviewed: 06/28/2020 Elsevier Patient Education  [place] Anesthesia, Adult, Care After This sheet gives you information about how to care for yourself after your procedure. Your health care provider may also give you more specific instructions. If you have problems or questions, contact your health care provider. What can I expect after the procedure? After the procedure, the following side effects are common: Pain or discomfort  at the IV site. Nausea. Vomiting. Sore throat. Trouble concentrating. Feeling cold or chills. Feeling weak or tired. Sleepiness and fatigue. Soreness and body aches. These side effects can affect parts of the body that were not involved in surgery. Follow these instructions at home: For the time period you were told by your health care provider:  Rest. Do not participate in activities where you could fall or become injured. Do not drive or use machinery. Do not drink alcohol. Do not take sleeping pills or medicines that cause drowsiness. Do not make important decisions or sign legal documents. Do not take care of children on your own. Eating and drinking Follow any instructions from your health care provider about eating or drinking restrictions. When you feel hungry, start by eating small amounts of foods that are soft and easy to digest (bland), such as toast. Gradually return to your regular  diet. Drink enough fluid to keep your urine pale yellow. If you vomit, rehydrate by drinking water, juice, or clear broth. General instructions If you have sleep apnea, surgery and certain medicines can increase your risk for breathing problems. Follow instructions from your health care provider about wearing your sleep device: Anytime you are sleeping, including during daytime naps. While taking prescription pain medicines, sleeping medicines, or medicines that make you drowsy. Have a responsible adult stay with you for the time you are told. It is important to have someone help care for you until you are awake and alert. Return to your normal activities as told by your health care provider. Ask your health care provider what activities are safe for you. Take over-the-counter and prescription medicines only as told by your health care provider. If you smoke, do not smoke without supervision. Keep all follow-up visits as told by your health care provider. This is important. Contact a health care provider if: You have nausea or vomiting that does not get better with medicine. You cannot eat or drink without vomiting. You have pain that does not get better with medicine. You are unable to pass urine. You develop a skin rash. You have a fever. You have redness around your IV site that gets worse. Get help right away if: You have difficulty breathing. You have chest pain. You have blood in your urine or stool, or you vomit blood. Summary After the procedure, it is common to have a sore throat or nausea. It is also common to feel tired. Have a responsible adult stay with you for the time you are told. It is important to have someone help care for you until you are awake and alert. When you feel hungry, start by eating small amounts of foods that are soft and easy to digest (bland), such as toast. Gradually return to your regular diet. Drink enough fluid to keep your urine pale yellow. Return  to your normal activities as told by your health care provider. Ask your health care provider what activities are safe for you. This information is not intended to replace advice given to you by your health care provider. Make sure you discuss any questions you have with your health care provider. Document Revised: 09/10/2019 Document Reviewed: 04/09/2019 Elsevier Patient Education  2022 [place]. How to Use Chlorhexidine for Bathing Chlorhexidine gluconate (CHG) is a germ-killing (antiseptic) solution that is used to clean the skin. It can get rid of the bacteria that normally live on the skin and can keep them away for about 24 hours. To clean your skin with CHG, you  may be given: A CHG solution to use in the shower or as part of a sponge bath. A prepackaged cloth that contains CHG. Cleaning your skin with CHG may help lower the risk for infection: While you are staying in the intensive care unit of the hospital. If you have a vascular access, such as a central line, to provide short-term or long-term access to your veins. If you have a catheter to drain urine from your bladder. If you are on a ventilator. A ventilator is a machine that helps you breathe by moving air in and out of your lungs. After surgery. What are the risks? Risks of using CHG include: A skin reaction. Hearing loss, if CHG gets in your ears and you have a perforated eardrum. Eye injury, if CHG gets in your eyes and is not rinsed out. The CHG product catching fire. Make sure that you avoid smoking and flames after applying CHG to your skin. Do not use CHG: If you have a chlorhexidine allergy or have previously reacted to chlorhexidine. On babies younger than 27 months of age. How to use CHG solution Use CHG only as told by your health care provider, and follow the instructions on the label. Use the full amount of CHG as directed. Usually, this is one bottle. During a shower Follow these steps when using CHG  solution during a shower (unless your health care provider gives you different instructions): Start the shower. Use your normal soap and shampoo to wash your face and hair. Turn off the shower or move out of the shower stream. Pour the CHG onto a clean washcloth. Do not use any type of brush or rough-edged sponge. Starting at your neck, lather your body down to your toes. Make sure you follow these instructions: If you will be having surgery, pay special attention to the part of your body where you will be having surgery. Scrub this area for at least 1 minute. Do not use CHG on your head or face. If the solution gets into your ears or eyes, rinse them well with water. Avoid your genital area. Avoid any areas of skin that have broken skin, cuts, or scrapes. Scrub your back and under your arms. Make sure to wash skin folds. Let the lather sit on your skin for 1-2 minutes or as long as told by your health care provider. Thoroughly rinse your entire body in the shower. Make sure that all body creases and crevices are rinsed well. Dry off with a clean towel. Do not put any substances on your body afterward--such as powder, lotion, or perfume--unless you are told to do so by your health care provider. Only use lotions that are recommended by the manufacturer. Put on clean clothes or pajamas. If it is the night before your surgery, sleep in clean sheets.  During a sponge bath Follow these steps when using CHG solution during a sponge bath (unless your health care provider gives you different instructions): Use your normal soap and shampoo to wash your face and hair. Pour the CHG onto a clean washcloth. Starting at your neck, lather your body down to your toes. Make sure you follow these instructions: If you will be having surgery, pay special attention to the part of your body where you will be having surgery. Scrub this area for at least 1 minute. Do not use CHG on your head or face. If the solution  gets into your ears or eyes, rinse them well with water. Avoid your genital  area. Avoid any areas of skin that have broken skin, cuts, or scrapes. Scrub your back and under your arms. Make sure to wash skin folds. Let the lather sit on your skin for 1-2 minutes or as long as told by your health care provider. Using a different clean, wet washcloth, thoroughly rinse your entire body. Make sure that all body creases and crevices are rinsed well. Dry off with a clean towel. Do not put any substances on your body afterward--such as powder, lotion, or perfume--unless you are told to do so by your health care provider. Only use lotions that are recommended by the manufacturer. Put on clean clothes or pajamas. If it is the night before your surgery, sleep in clean sheets. How to use CHG prepackaged cloths Only use CHG cloths as told by your health care provider, and follow the instructions on the label. Use the CHG cloth on clean, dry skin. Do not use the CHG cloth on your head or face unless your health care provider tells you to. When washing with the CHG cloth: Avoid your genital area. Avoid any areas of skin that have broken skin, cuts, or scrapes. Before surgery Follow these steps when using a CHG cloth to clean before surgery (unless your health care provider gives you different instructions): Using the CHG cloth, vigorously scrub the part of your body where you will be having surgery. Scrub using a back-and-forth motion for 3 minutes. The area on your body should be completely wet with CHG when you are done scrubbing. Do not rinse. Discard the cloth and let the area air-dry. Do not put any substances on the area afterward, such as powder, lotion, or perfume. Put on clean clothes or pajamas. If it is the night before your surgery, sleep in clean sheets.  For general bathing Follow these steps when using CHG cloths for general bathing (unless your health care provider gives you different  instructions). Use a separate CHG cloth for each area of your body. Make sure you wash between any folds of skin and between your fingers and toes. Wash your body in the following order, switching to a new cloth after each step: The front of your neck, shoulders, and chest. Both of your arms, under your arms, and your hands. Your stomach and groin area, avoiding the genitals. Your right leg and foot. Your left leg and foot. The back of your neck, your back, and your buttocks. Do not rinse. Discard the cloth and let the area air-dry. Do not put any substances on your body afterward--such as powder, lotion, or perfume--unless you are told to do so by your health care provider. Only use lotions that are recommended by the manufacturer. Put on clean clothes or pajamas. Contact a health care provider if: Your skin gets irritated after scrubbing. You have questions about using your solution or cloth. You swallow any chlorhexidine. Call your local poison control center (1-480-423-3892 in the [place] Get help right away if: Your eyes itch badly, or they become very red or swollen. Your skin itches badly and is red or swollen. Your hearing changes. You have trouble seeing. You have swelling or tingling in your mouth or throat. You have trouble breathing. These symptoms may represent a serious problem that is an emergency. Do not wait to see if the symptoms will go away. Get medical help right away. Call your local emergency services (911 in the [place] Do not drive yourself to the hospital. Summary Chlorhexidine gluconate (CHG) is a germ-killing (  antiseptic) solution that is used to clean the skin. Cleaning your skin with CHG may help to lower your risk for infection. You may be given CHG to use for bathing. It may be in a bottle or in a prepackaged cloth to use on your skin. Carefully follow your health care provider's instructions and the instructions on the product label. Do not use CHG if you have  a chlorhexidine allergy. Contact your health care provider if your skin gets irritated after scrubbing. This information is not intended to replace advice given to you by your health care provider. Make sure you discuss any questions you have with your health care provider. Document Revised: 03/07/2020 Document Reviewed: 03/07/2020 Elsevier Patient Education  2022 [employer].

## 2021-02-11 NOTE — H&P (Signed)
[facility] History and Physical  Reason for Referral: Gallstones  Referring Physician: Richardean Chimera, MD   Chief Complaint   Follow-up     Jeanette Suarez is a 66 y.o. female.  HPI: Jeanette Suarez is a 66 yo who comes in with some abdominal pain and weight loss for about the last year. She says she has RUQ and epigastric pain and that she does not eat because of this. She also has some associated bloating and nausea.  She has had EGD and colonoscopy in the past in 2019. She has never been told she has any IBS. She has some constipation and diarrhea but more diarrhea. She has lost about 25 lbs in the last 6 months. She denies any blood in her stool.   She has no cardiac history, she has not chest pain or SOB.   Past Medical History:  Diagnosis Date   Anemia    Anxiety    Depression    Family hx of colon cancer    GERD (gastroesophageal reflux disease)    Headache    History of hiatal hernia    Hypertension    Sjogren's disease (HCC)     Past Surgical History:  Procedure Laterality Date   ABDOMINAL HYSTERECTOMY     BIOPSY  07/22/2017   Procedure: BIOPSY;  Surgeon: Malissa Hippo, MD;  [facility]: [facility];  Service: Endoscopy;;  gastric polyp   BUNIONECTOMY Bilateral 1992   COLONOSCOPY N/A 07/22/2017   Procedure: COLONOSCOPY;  Surgeon: Malissa Hippo, MD;  [facility]: [facility];  Service: Endoscopy;  Laterality: N/A;   ESOPHAGOGASTRODUODENOSCOPY N/A 07/22/2017   Procedure: ESOPHAGOGASTRODUODENOSCOPY (EGD);  Surgeon: Malissa Hippo, MD;  [facility];  Service: Endoscopy;  Laterality: N/A;  7:30   eye Bilateral    lasix    Family History  Problem Relation Age of Onset   Hypertension Mother    Colon cancer Sister     Social History   Tobacco Use   Smoking status: Never   Smokeless tobacco: Never  Vaping Use   Vaping Use: Never used  Substance Use Topics   Alcohol use: No   Drug use: No    Medications: I have reviewed the  patient's current medications. Allergies as of 02/08/2021       Reactions   Ace Inhibitors Cough   Sulfa Antibiotics Swelling, Rash, Cough   Swelling to hands and lips.        Medication List        Accurate as of February 08, 2021  2:47 PM. If you have any questions, ask your nurse or doctor.          ALPRAZolam 0.5 MG tablet Commonly known as: XANAX Take 0.25 mg by mouth 2 (two) times daily as needed.   buPROPion 150 MG 24 hr tablet Commonly known as: WELLBUTRIN XL Take 150 mg by mouth daily.   cevimeline 30 MG capsule Commonly known as: EVOXAC Take 30 mg by mouth 2 (two) times daily.   Dotti 0.075 MG/24HR Generic drug: estradiol Place 1 patch onto the skin every 14 (fourteen) days.   GenTeal Tears 0.1-0.2-0.3 % Soln Apply 1 drop to eye daily as needed (both eyes for dry eyes).   iron polysaccharides 150 MG capsule Commonly known as: NIFEREX Take 1 capsule by mouth daily.   pantoprazole 40 MG tablet Commonly known as: PROTONIX Take 40 mg by mouth daily.   rosuvastatin 5 MG tablet Commonly known as:  CRESTOR °Take 5 mg by mouth daily. °  °sertraline 100 MG tablet °Commonly known as: ZOLOFT °Take 200 mg by mouth daily. °  ° °  ° ° ° °ROS:  °A comprehensive review of systems was negative except for: Gastrointestinal: positive for abdominal pain, nausea, and reflux symptoms °Genitourinary: positive for frequency and retention ° °Blood pressure (!) 144/88, pulse 79, temperature 97.8 °F (36.6 °C), temperature source Oral, resp. rate 14, height 5' 1.5" (1.562 m), weight 125 lb (56.7 kg), SpO2 98 %. °Physical Exam °Vitals reviewed.  °Constitutional:   °   Appearance: Normal appearance.  °HENT:  °   Head: Normocephalic.  °   Nose: Nose normal.  °   Mouth/Throat:  °   Mouth: Mucous membranes are moist.  °Eyes:  °   Extraocular Movements: Extraocular movements intact.  °Cardiovascular:  °   Rate and Rhythm: Normal rate and regular rhythm.  °Pulmonary:  °   Effort: Pulmonary effort  is normal.  °   Breath sounds: Normal breath sounds.  °Abdominal:  °   General: There is no distension.  °   Palpations: Abdomen is soft.  °   Tenderness: There is abdominal tenderness in the right upper quadrant and epigastric area.  °Musculoskeletal:     °   General: Normal range of motion.  °   Cervical back: Normal range of motion.  °Skin: °   General: Skin is warm.  °Neurological:  °   General: No focal deficit present.  °   Mental Status: She is alert and oriented to person, place, and time.  °Psychiatric:     °   Mood and Affect: Mood normal.  ° ° °Results: °CLINICAL DATA:  Epigastric abdominal pain, worse postprandial °  °EXAM: °NUCLEAR MEDICINE HEPATOBILIARY IMAGING WITH GALLBLADDER EF °  °TECHNIQUE: °Sequential images of the abdomen were obtained out to 60 minutes °following intravenous administration of radiopharmaceutical. After °slow intravenous infusion of 1.2 micrograms Cholecystokinin, °gallbladder ejection fraction was determined. °  °RADIOPHARMACEUTICALS:  5.2 mCi Tc-99m Choletec IV °  °COMPARISON:  Ultrasound December 29, 2020 °  °FINDINGS: °Prompt uptake and biliary excretion of activity by the liver is °seen. Gallbladder activity is visualized, consistent with patency of °cystic duct. Biliary activity passes into small bowel, consistent °with patent common bile duct. °  °Calculated gallbladder ejection fraction is 42%. (At 60 min, normal °ejection fraction is greater than 40%.) °  °IMPRESSION: °1.  Gallbladder ejection fraction lower limits of normal. °  °2.  Patent cystic and common bile ducts. °  °  °Electronically Signed °  By: Jeffrey  Waltz M.D. °  On: 01/18/2021 15:46 ° °CLINICAL DATA:  Abdominal pain 1 year. °  °EXAM: °ABDOMEN ULTRASOUND COMPLETE °  °COMPARISON:  Ultrasound 01/15/2020, CT 01/02/2020 °  °FINDINGS: °Gallbladder: Mild cholelithiasis with largest stone measuring 1.1 °cm. No wall thickening. No adjacent free fluid. Negative sonographic °Murphy sign. °  °Common bile duct:  Diameter: 3.1 mm. °  °Liver: Mild coarse parenchymal echogenicity compatible with a degree °of steatosis. 2 cm oval hyperechoic mass over the inferior right °lobe of the liver without significant change likely a hemangioma. °Portal vein is patent on color Doppler imaging with normal direction °of blood flow towards the liver. °  °IVC: No abnormality visualized. °  °Pancreas: Visualized portion unremarkable. °  °Spleen: Size and appearance within normal limits. °  °Right Kidney: Length: 9.5 cm. Echogenicity within normal limits. No °mass or hydronephrosis visualized. °  °Left Kidney: Length: 9.2 cm.   Echogenicity within normal limits. No °mass or hydronephrosis visualized. °  °Abdominal aorta: No aneurysm visualized. Calcified atherosclerotic °plaque is present. °  °Other findings: No free fluid. °  °IMPRESSION: °1. Stable cholelithiasis. No additional sonographic evidence to °suggest cholecystitis. °  °2. Findings suggesting mild hepatic steatosis. Stable 2 cm °hyperechoic right liver mass likely hemangioma. °  °  °Electronically Signed °  By: Daniel  Boyle M.D. °  On: 12/29/2020 16:49 ° °Assessment & Plan:  °Larya W Spring is a 65 y.o. female with gallstones and borderline gallbladder activity. She is tender in the RUQ and has pain with food. I told her I cannot say that all her of symptoms are from the gallbladder and that the weight loss and changes in stool could be from something else.  °-PLAN: I counseled the patient about the indication, risks and benefits of laparoscopic cholecystectomy.  She understands there is a very small chance for bleeding, infection, injury to normal structures (including common bile duct), conversion to open surgery, persistent symptoms, evolution of postcholecystectomy diarrhea, need for secondary interventions, anesthesia reaction, cardiopulmonary issues and other risks not specifically detailed here. I described the expected recovery, the plan for follow-up and the restrictions  during the recovery phase.  All questions were answered. ° ° ° °All questions were answered to the satisfaction of the patient and family. ° ° °Elian Gloster C Adelie Croswell °02/08/2021, 2:47 PM  ° ° ° ° ° °

## 2021-02-13 ENCOUNTER — Ambulatory Visit (HOSPITAL_COMMUNITY): Payer: 59 | Admitting: Anesthesiology

## 2021-02-13 ENCOUNTER — Encounter (HOSPITAL_COMMUNITY)
Admission: RE | Disposition: A | Discharge status: Home / Self Care | Payer: Self-pay | Source: Home / Self Care | Attending: General Surgery

## 2021-02-13 ENCOUNTER — Encounter (HOSPITAL_COMMUNITY): Payer: Self-pay | Admitting: General Surgery

## 2021-02-13 ENCOUNTER — Ambulatory Visit (HOSPITAL_COMMUNITY)
Admission: RE | Admit: 2021-02-13 | Discharge: 2021-02-13 | Disposition: A | Discharge status: Home / Self Care | Payer: 59 | Attending: General Surgery | Admitting: General Surgery

## 2021-02-13 ENCOUNTER — Other Ambulatory Visit: Payer: Self-pay

## 2021-02-13 DIAGNOSIS — K219 Gastro-esophageal reflux disease without esophagitis: Secondary | ICD-10-CM | POA: Diagnosis not present

## 2021-02-13 DIAGNOSIS — F418 Other specified anxiety disorders: Secondary | ICD-10-CM | POA: Diagnosis not present

## 2021-02-13 DIAGNOSIS — K8010 Calculus of gallbladder with chronic cholecystitis without obstruction: Secondary | ICD-10-CM | POA: Diagnosis not present

## 2021-02-13 DIAGNOSIS — K8020 Calculus of gallbladder without cholecystitis without obstruction: Secondary | ICD-10-CM

## 2021-02-13 DIAGNOSIS — R69 Illness, unspecified: Secondary | ICD-10-CM | POA: Diagnosis not present

## 2021-02-13 DIAGNOSIS — K828 Other specified diseases of gallbladder: Secondary | ICD-10-CM | POA: Diagnosis not present

## 2021-02-13 HISTORY — PX: CHOLECYSTECTOMY: SHX55

## 2021-02-13 SURGERY — LAPAROSCOPIC CHOLECYSTECTOMY
Anesthesia: General | Site: Abdomen

## 2021-02-13 MED ORDER — ONDANSETRON HCL 4 MG/2ML IJ SOLN
INTRAMUSCULAR | Status: AC
  Filled 2021-02-13: qty 2

## 2021-02-13 MED ORDER — CHLORHEXIDINE GLUCONATE CLOTH 2 % EX PADS: 6.0000 | MEDICATED_PAD | Freq: Once | CUTANEOUS | Status: DC

## 2021-02-13 MED ORDER — BUPIVACAINE HCL (PF) 0.5 % IJ SOLN
INTRAMUSCULAR | Status: AC
  Filled 2021-02-13: qty 30

## 2021-02-13 MED ORDER — LACTATED RINGERS IV SOLN: INTRAVENOUS | Status: DC

## 2021-02-13 MED ORDER — ORAL CARE MOUTH RINSE: 15.0000 mL | Freq: Once | OROMUCOSAL | Status: AC

## 2021-02-13 MED ORDER — EPHEDRINE SULFATE (PRESSORS) 50 MG/ML IJ SOLN
INTRAMUSCULAR | Status: DC | PRN
Start: 2021-02-13 — End: 2021-02-13
  Administered 2021-02-13: 10 mg via INTRAVENOUS

## 2021-02-13 MED ORDER — HEMOSTATIC AGENTS (NO CHARGE) OPTIME
TOPICAL | Status: DC | PRN
  Administered 2021-02-13: 1

## 2021-02-13 MED ORDER — EPHEDRINE 5 MG/ML INJ
INTRAVENOUS | Status: AC
  Filled 2021-02-13: qty 5

## 2021-02-13 MED ORDER — SUGAMMADEX SODIUM 500 MG/5ML IV SOLN
INTRAVENOUS | Status: DC | PRN
  Administered 2021-02-13: 200 mg via INTRAVENOUS

## 2021-02-13 MED ORDER — ONDANSETRON HCL 4 MG/2ML IJ SOLN: 4.0000 mg | Freq: Once | INTRAMUSCULAR | Status: DC | PRN

## 2021-02-13 MED ORDER — PROPOFOL 10 MG/ML IV BOLUS
INTRAVENOUS | Status: AC
  Filled 2021-02-13: qty 20

## 2021-02-13 MED ORDER — PROPOFOL 10 MG/ML IV BOLUS
INTRAVENOUS | Status: DC | PRN
Start: 2021-02-13 — End: 2021-02-13
  Administered 2021-02-13: 150 mg via INTRAVENOUS

## 2021-02-13 MED ORDER — SODIUM CHLORIDE 0.9 % IV SOLN
2.0000 g | INTRAVENOUS | Status: AC
  Administered 2021-02-13: 2 g via INTRAVENOUS

## 2021-02-13 MED ORDER — ONDANSETRON HCL 4 MG PO TABS: 4.0000 mg | ORAL_TABLET | Freq: Three times a day (TID) | ORAL | 1 refills | Status: DC | PRN

## 2021-02-13 MED ORDER — BUPIVACAINE HCL (PF) 0.5 % IJ SOLN
INTRAMUSCULAR | Status: DC | PRN
  Administered 2021-02-13: 20 mL

## 2021-02-13 MED ORDER — SODIUM CHLORIDE 0.9 % IR SOLN
Status: DC | PRN
  Administered 2021-02-13: 1000 mL

## 2021-02-13 MED ORDER — FENTANYL CITRATE (PF) 100 MCG/2ML IJ SOLN
INTRAMUSCULAR | Status: DC | PRN
Start: 2021-02-13 — End: 2021-02-13
  Administered 2021-02-13: 50 ug via INTRAVENOUS
  Administered 2021-02-13: 100 ug via INTRAVENOUS

## 2021-02-13 MED ORDER — KETOROLAC TROMETHAMINE 30 MG/ML IJ SOLN
INTRAMUSCULAR | Status: DC | PRN
  Administered 2021-02-13: 30 mg via INTRAVENOUS

## 2021-02-13 MED ORDER — GLYCOPYRROLATE 0.2 MG/ML IJ SOLN
INTRAMUSCULAR | Status: DC | PRN
  Administered 2021-02-13: .2 mg via INTRAVENOUS

## 2021-02-13 MED ORDER — DEXAMETHASONE SODIUM PHOSPHATE 10 MG/ML IJ SOLN
INTRAMUSCULAR | Status: AC
  Filled 2021-02-13: qty 1

## 2021-02-13 MED ORDER — GLYCOPYRROLATE PF 0.2 MG/ML IJ SOSY
PREFILLED_SYRINGE | INTRAMUSCULAR | Status: AC
  Filled 2021-02-13: qty 4

## 2021-02-13 MED ORDER — FENTANYL CITRATE (PF) 250 MCG/5ML IJ SOLN
INTRAMUSCULAR | Status: AC
  Filled 2021-02-13: qty 5

## 2021-02-13 MED ORDER — ROCURONIUM BROMIDE 10 MG/ML (PF) SYRINGE
PREFILLED_SYRINGE | INTRAVENOUS | Status: AC
  Filled 2021-02-13: qty 10

## 2021-02-13 MED ORDER — LIDOCAINE HCL (CARDIAC) PF 50 MG/5ML IV SOSY
PREFILLED_SYRINGE | INTRAVENOUS | Status: DC | PRN
  Administered 2021-02-13: 50 mg via INTRAVENOUS

## 2021-02-13 MED ORDER — FENTANYL CITRATE PF 50 MCG/ML IJ SOSY
25.0000 ug | PREFILLED_SYRINGE | INTRAMUSCULAR | Status: DC | PRN
  Administered 2021-02-13: 50 ug via INTRAVENOUS
  Filled 2021-02-13: qty 1

## 2021-02-13 MED ORDER — SODIUM CHLORIDE 0.9 % IV SOLN
INTRAVENOUS | Status: AC
  Filled 2021-02-13: qty 2

## 2021-02-13 MED ORDER — DEXAMETHASONE SODIUM PHOSPHATE 10 MG/ML IJ SOLN
INTRAMUSCULAR | Status: DC | PRN
Start: 2021-02-13 — End: 2021-02-13
  Administered 2021-02-13: 5 mg via INTRAVENOUS

## 2021-02-13 MED ORDER — CHLORHEXIDINE GLUCONATE 0.12 % MT SOLN
15.0000 mL | Freq: Once | OROMUCOSAL | Status: AC
  Administered 2021-02-13: 15 mL via OROMUCOSAL

## 2021-02-13 MED ORDER — OXYCODONE HCL 5 MG PO TABS: 5.0000 mg | ORAL_TABLET | ORAL | 0 refills | Status: DC | PRN

## 2021-02-13 MED ORDER — ONDANSETRON HCL 4 MG/2ML IJ SOLN
INTRAMUSCULAR | Status: DC | PRN
Start: 2021-02-13 — End: 2021-02-13
  Administered 2021-02-13: 4 mg via INTRAVENOUS

## 2021-02-13 MED ORDER — KETOROLAC TROMETHAMINE 30 MG/ML IJ SOLN
INTRAMUSCULAR | Status: AC
  Filled 2021-02-13: qty 1

## 2021-02-13 MED ORDER — ROCURONIUM BROMIDE 100 MG/10ML IV SOLN
INTRAVENOUS | Status: DC | PRN
  Administered 2021-02-13: 50 mg via INTRAVENOUS

## 2021-02-13 MED ORDER — SCOPOLAMINE 1 MG/3DAYS TD PT72
1.0000 | MEDICATED_PATCH | Freq: Once | TRANSDERMAL | Status: DC
  Administered 2021-02-13: 1.5 mg via TRANSDERMAL
  Filled 2021-02-13: qty 1

## 2021-02-13 SURGICAL SUPPLY — 37 items
APPLIER CLIP ROT 10 11.4 M/L (STAPLE) ×2
BAG RETRIEVAL 10 (BASKET) ×1
BLADE SURG 15 STRL LF DISP TIS (BLADE) ×1 IMPLANT
BLADE SURG 15 STRL SS (BLADE) ×2
CHLORAPREP W/TINT 26 (MISCELLANEOUS) ×2 IMPLANT
CLIP APPLIE ROT 10 11.4 M/L (STAPLE) ×1 IMPLANT
CLOTH BEACON ORANGE TIMEOUT ST (SAFETY) ×2 IMPLANT
COVER LIGHT HANDLE STERIS (MISCELLANEOUS) ×4 IMPLANT
DERMABOND ADVANCED (GAUZE/BANDAGES/DRESSINGS) ×1
DERMABOND ADVANCED .7 DNX12 (GAUZE/BANDAGES/DRESSINGS) ×1 IMPLANT
ELECT REM PT RETURN 9FT ADLT (ELECTROSURGICAL) ×2
ELECTRODE REM PT RTRN 9FT ADLT (ELECTROSURGICAL) ×1 IMPLANT
GAUZE 4X4 16PLY ~~LOC~~+RFID DBL (SPONGE) ×2 IMPLANT
GLOVE SURG ENC MOIS LTX SZ6.5 (GLOVE) ×2 IMPLANT
GLOVE SURG UNDER POLY LF SZ7 (GLOVE) ×8 IMPLANT
GOWN STRL REUS W/TWL LRG LVL3 (GOWN DISPOSABLE) ×6 IMPLANT
HEMOSTAT SNOW SURGICEL 2X4 (HEMOSTASIS) ×2 IMPLANT
INST SET LAPROSCOPIC AP (KITS) ×2 IMPLANT
KIT TURNOVER KIT A (KITS) ×2 IMPLANT
MANIFOLD NEPTUNE II (INSTRUMENTS) ×2 IMPLANT
NDL INSUFFLATION 14GA 120MM (NEEDLE) ×1 IMPLANT
NEEDLE INSUFFLATION 14GA 120MM (NEEDLE) ×2 IMPLANT
NS IRRIG 1000ML POUR BTL (IV SOLUTION) ×2 IMPLANT
PACK LAP CHOLE LZT030E (CUSTOM PROCEDURE TRAY) ×2 IMPLANT
PAD ARMBOARD 7.5X6 YLW CONV (MISCELLANEOUS) ×2 IMPLANT
SET BASIN LINEN APH (SET/KITS/TRAYS/PACK) ×2 IMPLANT
SET TUBE SMOKE EVAC HIGH FLOW (TUBING) ×2 IMPLANT
SLEEVE ENDOPATH XCEL 5M (ENDOMECHANICALS) ×2 IMPLANT
SUT MNCRL AB 4-0 PS2 18 (SUTURE) ×4 IMPLANT
SUT VICRYL 0 UR6 27IN ABS (SUTURE) ×2 IMPLANT
SYS BAG RETRIEVAL 10MM (BASKET) ×1
SYSTEM BAG RETRIEVAL 10MM (BASKET) ×1 IMPLANT
TROCAR ENDO BLADELESS 11MM (ENDOMECHANICALS) ×2 IMPLANT
TROCAR XCEL NON-BLD 5MMX100MML (ENDOMECHANICALS) ×2 IMPLANT
TROCAR XCEL UNIV SLVE 11M 100M (ENDOMECHANICALS) ×2 IMPLANT
TUBE CONNECTING 12X1/4 (SUCTIONS) ×2 IMPLANT
WARMER LAPAROSCOPE (MISCELLANEOUS) ×2 IMPLANT

## 2021-02-13 NOTE — Progress Notes (Signed)
[facility]  Updated husband, 2/17 post op phone call, Rx to [facility]  Curlene Labrum, MD [facility] [address] [place], [place] [address] [phone] (office)

## 2021-02-13 NOTE — Op Note (Signed)
Operative Note   Preoperative Diagnosis: Symptomatic cholelithiasis   Postoperative Diagnosis: Same   Procedure(s) Performed: Laparoscopic cholecystectomy   Surgeon: Ria Comment C. Constance Haw, MD   Assistants: No qualified resident was available   Anesthesia: General endotracheal   Anesthesiologist: Louann Sjogren, MD    Specimens: Gallbladder    Estimated Blood Loss: Minimal    Blood Replacement: None    Complications: None    Operative Findings: Distended gallbladder    Procedure: The patient was taken to the operating room and placed supine. General endotracheal anesthesia was induced. Intravenous antibiotics were administered per protocol. An orogastric tube positioned to decompress the stomach. The abdomen was prepared and draped in the usual sterile fashion.    A supraumbilical incision was made and a Veress technique was utilized to achieve pneumoperitoneum to 15 mmHg with carbon dioxide. A 11 mm optiview port was placed through the supraumbilical region, and a 10 mm 0-degree operative laparoscope was introduced. The area underlying the trocar and Veress needle were inspected and without evidence of injury.  Remaining trocars were placed under direct vision. Two 5 mm ports were placed in the right abdomen, between the anterior axillary and midclavicular line.  A final 11 mm port was placed through the mid-epigastrium, near the falciform ligament.    The gallbladder fundus was elevated cephalad and the infundibulum was retracted to the patient's right. The gallbladder/cystic duct junction was skeletonized. The cystic artery noted in the triangle of Calot and was also skeletonized.  We then continued liberal medial and lateral dissection until the critical view of safety was achieved.    The cystic duct and cystic artery were triply clipped and divided. A small posterior vessel was noted in the mesentery and was doubly clipped and divided. The gallbladder was then dissected from the  liver bed with electrocautery. The specimen was placed in an Endopouch and was retrieved through the epigastric site.   Final inspection revealed acceptable hemostasis. Surgical SNOW was placed in the gallbladder bed.  Trocars were removed and pneumoperitoneum was released.  0 Vicryl fascial sutures were used to close the epigastric and umbilical port sites. Skin incisions were closed with 4-0 Monocryl subcuticular sutures and Dermabond. The patient was awakened from anesthesia and extubated without complication.    Curlene Labrum, MD [facility] [address] [place], [place] [address] [phone] (office)

## 2021-02-13 NOTE — Anesthesia Preprocedure Evaluation (Signed)
Anesthesia Evaluation  Patient identified by MRN, date of birth, ID band Patient awake    Reviewed: Allergy & Precautions, H&P , NPO status , Patient's Chart, lab work & pertinent test results, reviewed documented beta blocker date and time   Airway Mallampati: II  TM Distance: >3 FB Neck ROM: full    Dental no notable dental hx.    Pulmonary neg pulmonary ROS,    Pulmonary exam normal breath sounds clear to auscultation       Cardiovascular Exercise Tolerance: Good hypertension, negative cardio ROS   Rhythm:regular Rate:Normal     Neuro/Psych  Headaches, PSYCHIATRIC DISORDERS Anxiety Depression    GI/Hepatic Neg liver ROS, hiatal hernia, GERD  Medicated,  Endo/Other  negative endocrine ROS  Renal/GU negative Renal ROS  negative genitourinary   Musculoskeletal   Abdominal   Peds  Hematology  (+) Blood dyscrasia, anemia ,   Anesthesia Other Findings   Reproductive/Obstetrics negative OB ROS                             Anesthesia Physical Anesthesia Plan  ASA: 2  Anesthesia Plan: General and General ETT   Post-op Pain Management:    Induction:   PONV Risk Score and Plan: Ondansetron and Scopolamine patch - Pre-op  Airway Management Planned:   Additional Equipment:   Intra-op Plan:   Post-operative Plan:   Informed Consent: I have reviewed the patients History and Physical, chart, labs and discussed the procedure including the risks, benefits and alternatives for the proposed anesthesia with the patient or authorized representative who has indicated his/her understanding and acceptance.     Dental Advisory Given  Plan Discussed with: CRNA  Anesthesia Plan Comments:         Anesthesia Quick Evaluation

## 2021-02-13 NOTE — Discharge Instructions (Signed)
Discharge Laparoscopic Surgery Instructions:  Common Complaints: Right shoulder pain is common after laparoscopic surgery. This is secondary to the gas used in the surgery being trapped under the diaphragm.  Walk to help your body absorb the gas. This will improve in a few days. Pain at the port sites are common, especially the larger port sites. This will improve with time.  Some nausea is common and poor appetite. The main goal is to stay hydrated the first few days after surgery.   Diet/ Activity: Diet as tolerated. You may not have an appetite, but it is important to stay hydrated. Drink 64 ounces of water a day. Your appetite will return with time.  Shower per your regular routine daily.  Do not take hot showers. Take warm showers that are less than 10 minutes. Rest and listen to your body, but do not remain in bed all day.  Walk everyday for at least 15-20 minutes. Deep cough and move around every 1-2 hours in the first few days after surgery.  Do not lift > 10 lbs, perform excessive bending, pushing, pulling, squatting for 1-2 weeks after surgery.  Do not pick at the dermabond glue on your incision sites.  This glue film will remain in place for 1-2 weeks and will start to peel off.  Do not place lotions or balms on your incision unless instructed to specifically by Dr. Breeley Bischof.   Pain Expectations and Narcotics: -After surgery you will have pain associated with your incisions and this is normal. The pain is muscular and nerve pain, and will get better with time. -You are encouraged and expected to take non narcotic medications like tylenol and ibuprofen (when able) to treat pain as multiple modalities can aid with pain treatment. -Narcotics are only used when pain is severe or there is breakthrough pain. -You are not expected to have a pain score of 0 after surgery, as we cannot prevent pain. A pain score of 3-4 that allows you to be functional, move, walk, and tolerate some activity is  the goal. The pain will continue to improve over the days after surgery and is dependent on your surgery. -Due to [place] law, we are only able to give a certain amount of pain medication to treat post operative pain, and we only give additional narcotics on a patient by patient basis.  -For most laparoscopic surgery, studies have shown that the majority of patients only need 10-15 narcotic pills, and for open surgeries most patients only need 15-20.   -Having appropriate expectations of pain and knowledge of pain management with non narcotics is important as we do not want anyone to become addicted to narcotic pain medication.  -Using ice packs in the first 48 hours and heating pads after 48 hours, wearing an abdominal binder (when recommended), and using over the counter medications are all ways to help with pain management.   -Simple acts like meditation and mindfulness practices after surgery can also help with pain control and research has proven the benefit of these practices.  Medication: Take tylenol and ibuprofen as needed for pain control, alternating every 4-6 hours.  Example:  Tylenol 1000mg @ 6am, 12noon, 6pm, 12midnight (Do not exceed 4000mg of tylenol a day). Ibuprofen 800mg @ 9am, 3pm, 9pm, 3am (Do not exceed 3600mg of ibuprofen a day).  Take Roxicodone for breakthrough pain every 4 hours.  Take Colace for constipation related to narcotic pain medication. If you do not have a bowel movement in 2 days, take Miralax   over the counter.  Drink plenty of water to also prevent constipation.   Contact Information: If you have questions or concerns, please call our office, [phone], Monday- Thursday 8AM-5PM and Friday 8AM-12Noon.  If it is after hours or on the weekend, please call [facility] Main Number, [phone], [phone], and ask to speak to the surgeon on call for Dr. Kiam Bransfield at [place].   

## 2021-02-13 NOTE — Anesthesia Postprocedure Evaluation (Signed)
Anesthesia Post Note  Patient: Jeanette Suarez  Procedure(s) Performed: LAPAROSCOPIC CHOLECYSTECTOMY (Abdomen)  Patient location during evaluation: Phase II Anesthesia Type: General Level of consciousness: awake Pain management: pain level controlled Vital Signs Assessment: post-procedure vital signs reviewed and stable Respiratory status: spontaneous breathing and respiratory function stable Cardiovascular status: blood pressure returned to baseline and stable Postop Assessment: no headache and no apparent nausea or vomiting Anesthetic complications: no Comments: Late entry   No notable events documented.   Last Vitals:  Vitals:   02/13/21 0812 02/13/21 1025  BP: (!) 156/75 (!) 155/93  Pulse: 94 (!) 112  Resp: 16 (!) 8  Temp: 36.7 C   SpO2: 100% 100%    Last Pain:  Vitals:   02/13/21 0812  TempSrc: Oral  PainSc: 0-No pain                 Windell Norfolk

## 2021-02-13 NOTE — Interval H&P Note (Signed)
History and Physical Interval Note:  02/13/2021 7:55 AM  Jeanette Suarez  has presented today for surgery, with the diagnosis of Cholelthiaisis.  The various methods of treatment have been discussed with the patient and family. After consideration of risks, benefits and other options for treatment, the patient has consented to  Procedure(s): LAPAROSCOPIC CHOLECYSTECTOMY (N/A) as a surgical intervention.  The patient's history has been reviewed, patient examined, no change in status, stable for surgery.  I have reviewed the patient's chart and labs.  Questions were answered to the patient's satisfaction.     Lucretia Roers

## 2021-02-13 NOTE — Transfer of Care (Signed)
Immediate Anesthesia Transfer of Care Note  Patient: Jeanette Suarez  Procedure(s) Performed: LAPAROSCOPIC CHOLECYSTECTOMY (Abdomen)  Patient Location: PACU  Anesthesia Type:General  Level of Consciousness: awake and patient cooperative  Airway & Oxygen Therapy: Patient Spontanous Breathing and non-rebreather face mask  Post-op Assessment: Report given to RN and Post -op Vital signs reviewed and stable  Post vital signs: Reviewed and stable  Last Vitals:  Vitals Value Taken Time  BP 155/93 02/13/21 1024  Temp 97.5   Pulse 112 02/13/21 1025  Resp 8 02/13/21 1025  SpO2 100 % 02/13/21 1025  Vitals shown include unvalidated device data.  Last Pain:  Vitals:   02/13/21 0812  TempSrc: Oral  PainSc: 0-No pain         Complications: No notable events documented.

## 2021-02-13 NOTE — Anesthesia Procedure Notes (Signed)
Procedure Name: Intubation Date/Time: 02/13/2021 9:35 AM Performed by: Vista Deck, CRNA Pre-anesthesia Checklist: Patient identified, Patient being monitored, Timeout performed, Emergency Drugs available and Suction available Patient Re-evaluated:Patient Re-evaluated prior to induction Oxygen Delivery Method: Circle system utilized Preoxygenation: Pre-oxygenation with 100% oxygen Induction Type: IV induction Ventilation: Mask ventilation without difficulty Laryngoscope Size: Mac and 3 Grade View: Grade I Tube type: Oral Tube size: 7.0 mm Number of attempts: 1 Airway Equipment and Method: Stylet Placement Confirmation: ETT inserted through vocal cords under direct vision, positive ETCO2 and breath sounds checked- equal and bilateral Secured at: 21 cm Tube secured with: Tape Dental Injury: Teeth and Oropharynx as per pre-operative assessment

## 2021-02-14 ENCOUNTER — Encounter (HOSPITAL_COMMUNITY): Payer: Self-pay | Admitting: General Surgery

## 2021-02-14 LAB — SURGICAL PATHOLOGY

## 2021-02-21 ENCOUNTER — Encounter: Payer: 59 | Admitting: Psychology

## 2021-02-24 ENCOUNTER — Ambulatory Visit (INDEPENDENT_AMBULATORY_CARE_PROVIDER_SITE_OTHER): Payer: 59 | Admitting: General Surgery

## 2021-02-24 DIAGNOSIS — K8020 Calculus of gallbladder without cholecystitis without obstruction: Secondary | ICD-10-CM

## 2021-02-24 NOTE — Progress Notes (Signed)
[facility]  I am calling the patient for post operative evaluation. This is not a billable encounter as it is under the global charges for the surgery.  The patient had a laparoscopic cholecystectomy on 2/6. The patient reports that she is doing well. The are tolerating a diet, having good pain control, and having regular Bms.  The incisions are healing but one did have the glue fall off. The patient has no concerns.   Pathology: FINAL MICROSCOPIC DIAGNOSIS:   A. GALLBLADDER, CHOLECYSTECTOMY:  -  Mild chronic cholecystitis, calculous.  -  Cholesterolosis.  -  Histologically benign cystic duct lymph node.   Will see the patient PRN.  Diet and activity as tolerated.  Ok to drive if feels up to it and not using narcotics.   Algis Greenhouse, MD [facility] [address] [place], [place] [address] [phone] (office)

## 2021-03-07 DIAGNOSIS — E538 Deficiency of other specified B group vitamins: Secondary | ICD-10-CM | POA: Diagnosis not present

## 2021-03-08 ENCOUNTER — Encounter: Payer: 59 | Admitting: Psychology

## 2021-03-14 DIAGNOSIS — K2100 Gastro-esophageal reflux disease with esophagitis, without bleeding: Secondary | ICD-10-CM | POA: Diagnosis not present

## 2021-03-14 DIAGNOSIS — G309 Alzheimer's disease, unspecified: Secondary | ICD-10-CM | POA: Diagnosis not present

## 2021-03-14 DIAGNOSIS — Z6821 Body mass index (BMI) 21.0-21.9, adult: Secondary | ICD-10-CM | POA: Diagnosis not present

## 2021-05-12 DIAGNOSIS — E782 Mixed hyperlipidemia: Secondary | ICD-10-CM | POA: Diagnosis not present

## 2021-05-12 DIAGNOSIS — K219 Gastro-esophageal reflux disease without esophagitis: Secondary | ICD-10-CM | POA: Diagnosis not present

## 2021-05-12 DIAGNOSIS — I10 Essential (primary) hypertension: Secondary | ICD-10-CM | POA: Diagnosis not present

## 2021-05-12 DIAGNOSIS — Z1329 Encounter for screening for other suspected endocrine disorder: Secondary | ICD-10-CM | POA: Diagnosis not present

## 2021-05-12 DIAGNOSIS — D529 Folate deficiency anemia, unspecified: Secondary | ICD-10-CM | POA: Diagnosis not present

## 2021-05-12 DIAGNOSIS — D519 Vitamin B12 deficiency anemia, unspecified: Secondary | ICD-10-CM | POA: Diagnosis not present

## 2021-05-12 DIAGNOSIS — E7849 Other hyperlipidemia: Secondary | ICD-10-CM | POA: Diagnosis not present

## 2021-05-12 DIAGNOSIS — Z0001 Encounter for general adult medical examination with abnormal findings: Secondary | ICD-10-CM | POA: Diagnosis not present

## 2021-05-12 DIAGNOSIS — D649 Anemia, unspecified: Secondary | ICD-10-CM | POA: Diagnosis not present

## 2021-07-12 DIAGNOSIS — R69 Illness, unspecified: Secondary | ICD-10-CM | POA: Diagnosis not present

## 2021-07-12 DIAGNOSIS — R300 Dysuria: Secondary | ICD-10-CM | POA: Diagnosis not present

## 2021-07-12 DIAGNOSIS — R030 Elevated blood-pressure reading, without diagnosis of hypertension: Secondary | ICD-10-CM | POA: Diagnosis not present

## 2021-07-12 DIAGNOSIS — N9089 Other specified noninflammatory disorders of vulva and perineum: Secondary | ICD-10-CM | POA: Diagnosis not present

## 2021-07-12 DIAGNOSIS — N3090 Cystitis, unspecified without hematuria: Secondary | ICD-10-CM | POA: Diagnosis not present

## 2021-07-31 ENCOUNTER — Encounter (INDEPENDENT_AMBULATORY_CARE_PROVIDER_SITE_OTHER): Payer: Self-pay | Admitting: *Deleted

## 2021-08-11 ENCOUNTER — Telehealth (INDEPENDENT_AMBULATORY_CARE_PROVIDER_SITE_OTHER): Payer: Self-pay

## 2021-08-11 ENCOUNTER — Encounter (INDEPENDENT_AMBULATORY_CARE_PROVIDER_SITE_OTHER): Payer: Self-pay

## 2021-08-11 ENCOUNTER — Encounter (INDEPENDENT_AMBULATORY_CARE_PROVIDER_SITE_OTHER): Payer: Self-pay | Admitting: Gastroenterology

## 2021-08-11 ENCOUNTER — Other Ambulatory Visit (INDEPENDENT_AMBULATORY_CARE_PROVIDER_SITE_OTHER): Payer: Self-pay

## 2021-08-11 ENCOUNTER — Ambulatory Visit (INDEPENDENT_AMBULATORY_CARE_PROVIDER_SITE_OTHER): Payer: 59 | Admitting: Gastroenterology

## 2021-08-11 DIAGNOSIS — R1084 Generalized abdominal pain: Secondary | ICD-10-CM | POA: Insufficient documentation

## 2021-08-11 DIAGNOSIS — R634 Abnormal weight loss: Secondary | ICD-10-CM | POA: Diagnosis not present

## 2021-08-11 MED ORDER — PEG 3350-KCL-NA BICARB-NACL 420 G PO SOLR: 4000.0000 mL | ORAL | 0 refills | Status: DC

## 2021-08-11 NOTE — Telephone Encounter (Signed)
Jeanette Suarez, CMA  ?

## 2021-08-11 NOTE — H&P (View-Only) (Signed)
Katrinka Blazing, M.D. Gastroenterology & Hepatology [facility] [address] [place], [place] [address] Primary Care Physician: Richardean Chimera, MD [address] [place] [address]  Referring MD: PCP  Chief Complaint: Abdominal pain, weight loss  History of Present Illness: Jeanette Suarez is a 66 y.o. female possible Alzheimer's disease, depression, family history of colon cancer, hypertension, Sjogren's syndrome, anxiety, who presents for evaluation of abdominal pain and weight loss.  Patient is coming to the office with her daughter in law.  Patient is not the best historian as she is forgetful about some details.  Patient reports that around January 2023, she presented recurrent episodes of pain in her abdomen diffusely described as a pressure-like pain, along with some stabbing pain episodes in between in both of her flanks. She does not have any nighttimes symptoms. She believes that after she had her cholecystectomy, he pain actually worsened. Has not noticed any specific type of food or activity that makes her symptoms worse, but states she has noticed some of the pain episodes start 10 minutes after having a meal. She does not avoid eating to avoid the pain. Family member also endorses she has pain sometimes even if she does not eat.  She is concerned as she has lost significant amount of weight. She has lost 12 lb since February 2023. She has not changed the amount of food that she was eating prior to her surgery.  The patient denies having any nausea, vomiting, fever, chills, hematochezia, melena, hematemesis, diarrhea, jaundice, pruritus.  The patient was endorsing previous episodes of abdominal pain in the right upper quadrant for which she was seen by general surgery on 02/08/2021.  She underwent a laparoscopic cholecystectomy on 02/13/2021. Has not noticed any difference in the  amount or consistency of the bowel movements since she  had her cholecystectomy.  The family member (daughter-in-law) reached me at the end of the visit and stated that her mother-in-law was having problems with memory with a possible diagnosis of Alzheimer's disease.  She states that she is concerned that she does not endorse all her symptoms accurately as she has boys she was taking Imodium in the past for diarrhea.  She was advised to stop taking it but she is not sure if she stopped taking the medication.  I noticed during the visit that the patient could not remember some of the questions I asked her.  Most recent blood work-up from 05/12/2021 showed CMP with normal electrolytes, creatinine 0.97 and BUN of 7, ALT 12, AST 14, alkaline phosphatase 79, total bili 0.6.  TSH 1.1, TIBC 352, iron 70, iron saturation 20%, ferritin 25, CBC with hemoglobin of 14.3, WBC 5.5 and platelets of 255.  CT of the abdomen and pelvis with IV contrast that was performed prior to her cholecystectomy showed stable cholelithiasis and hepatic acidosis with a 2 cm hemangioma.  Last EGD: 07/22/2017, there was a small erosion at the GE junction, 2 cm hiatal hernia, 2 small sessile polyps (fundic gland polyp), normal duodenum. Last Colonoscopy: 07/22/2017, normal terminal ileum, presence of external hemorrhoids.  Advised to repeat colonoscopy in 5 years.  FHx: neg for any gastrointestinal/liver disease, two sisters had a history of colon cancer at 94 and 53s. Social: neg smoking, alcohol or illicit drug use Surgical: hysterectomy and choelcystectomy  Past Medical History: Past Medical History:  Diagnosis Date   Anemia    Anxiety    Depression    Family hx of  colon cancer    GERD (gastroesophageal reflux disease)    Headache    History of hiatal hernia    Hypertension    Sjogren's disease (HCC)     Past Surgical History: Past Surgical History:  Procedure Laterality Date   ABDOMINAL HYSTERECTOMY     BIOPSY  07/22/2017   Procedure: BIOPSY;  Surgeon: Malissa Hippo, MD;   [facility];  Service: Endoscopy;;  gastric polyp   BUNIONECTOMY Bilateral 1992   CHOLECYSTECTOMY N/A 02/13/2021   Procedure: LAPAROSCOPIC CHOLECYSTECTOMY;  Surgeon: Lucretia Roers, MD;  Location: [facility];  Service: General;  Laterality: N/A;   COLONOSCOPY N/A 07/22/2017   Procedure: COLONOSCOPY;  Surgeon: Malissa Hippo, MD;  [facility];  Service: Endoscopy;  Laterality: N/A;   ESOPHAGOGASTRODUODENOSCOPY N/A 07/22/2017   Procedure: ESOPHAGOGASTRODUODENOSCOPY (EGD);  Surgeon: Malissa Hippo, MD;  [facility];  Service: Endoscopy;  Laterality: N/A;  7:30   eye Bilateral    lasix    Family History: Family History  Problem Relation Age of Onset   Hypertension Mother    Colon cancer Sister     Social History: Social History   Tobacco Use  Smoking Status Never  Smokeless Tobacco Never   Social History   Substance and Sexual Activity  Alcohol Use No   Social History   Substance and Sexual Activity  Drug Use No    Allergies: Allergies  Allergen Reactions   Ace Inhibitors Cough   Sulfa Antibiotics Swelling, Rash and Cough    Swelling to hands and lips.    Medications: Current Outpatient Medications  Medication Sig Dispense Refill   ALPRAZolam (XANAX) 0.5 MG tablet Take 0.25 mg by mouth 2 (two) times daily as needed.      Artificial Tear Solution (GENTEAL TEARS) 0.1-0.2-0.3 % SOLN Apply 1 drop to eye daily as needed (both eyes for dry eyes).     ascorbic acid (VITAMIN C) 500 MG tablet Take 500 mg by mouth daily.     buPROPion (WELLBUTRIN XL) 150 MG 24 hr tablet Take 150 mg by mouth daily.     cevimeline (EVOXAC) 30 MG capsule Take 30 mg by mouth 2 (two) times daily.      Coenzyme Q10 (COQ10 PO) Take by mouth daily.     pantoprazole (PROTONIX) 40 MG tablet Take 40 mg by mouth daily.     DOTTI 0.075 MG/24HR Place 1 patch onto the skin every 14 (fourteen) days. (Patient not taking: Reported on 08/11/2021)     iron polysaccharides  (NIFEREX) 150 MG capsule Take 1 capsule by mouth daily. (Patient not taking: Reported on 08/11/2021)     ondansetron (ZOFRAN) 4 MG tablet Take 1 tablet (4 mg total) by mouth every 8 (eight) hours as needed. (Patient not taking: Reported on 08/11/2021) 30 tablet 1   oxyCODONE (ROXICODONE) 5 MG immediate release tablet Take 1 tablet (5 mg total) by mouth every 4 (four) hours as needed for severe pain or breakthrough pain. (Patient not taking: Reported on 08/11/2021) 20 tablet 0   rosuvastatin (CRESTOR) 5 MG tablet Take 5 mg by mouth daily. (Patient not taking: Reported on 08/11/2021)     sertraline (ZOLOFT) 100 MG tablet Take 200 mg by mouth daily.  (Patient not taking: Reported on 08/11/2021)     No current facility-administered medications for this visit.    Review of Systems: GENERAL: negative for malaise, night sweats HEENT: No changes in hearing or vision, no nose bleeds or other  nasal problems. NECK: Negative for lumps, goiter, pain and significant neck swelling RESPIRATORY: Negative for cough, wheezing CARDIOVASCULAR: Negative for chest pain, leg swelling, palpitations, orthopnea GI: SEE HPI MUSCULOSKELETAL: Negative for joint pain or swelling, back pain, and muscle pain. SKIN: Negative for lesions, rash PSYCH: Negative for sleep disturbance, mood disorder and recent psychosocial stressors. HEMATOLOGY Negative for prolonged bleeding, bruising easily, and swollen nodes. ENDOCRINE: Negative for cold or heat intolerance, polyuria, polydipsia and goiter. NEURO: negative for tremor, gait imbalance, syncope and seizures. The remainder of the review of systems is noncontributory.   Physical Exam: BP 123/79 (BP Location: Left Arm, Patient Position: Sitting, Cuff Size: Normal)   Pulse (!) 114   Temp 98.1 F (36.7 C) (Oral)   Ht 5\' 2"  (1.575 m)   Wt 113 lb 4.8 oz (51.4 kg)   BMI 20.72 kg/m  GENERAL: The patient is AO x3, in no acute distress. Underweight. HEENT: Head is normocephalic and  atraumatic. EOMI are intact. Mouth is well hydrated and without lesions. NECK: Supple. No masses LUNGS: Clear to auscultation. No presence of rhonchi/wheezing/rales. Adequate chest expansion HEART: RRR, normal s1 and s2. ABDOMEN: Soft, nontender, no guarding, no peritoneal signs, and nondistended. BS +. No masses. EXTREMITIES: Without any cyanosis, clubbing, rash, lesions or edema. NEUROLOGIC: AOx3, no focal motor deficit. SKIN: no jaundice, no rashes   Imaging/Labs: as above  I personally reviewed and interpreted the available labs, imaging and endoscopic files.  Impression and Plan: Jeanette Suarez is a 66 y.o. female possible Alzheimer's disease, depression, family history of colon cancer, hypertension, Sjogren's syndrome, anxiety, who presents for evaluation of abdominal pain and weight loss.  The patient has presented progressive weight loss that has been unintentional despite the fact that she endorses having an adequate oral intake.  Her abdominal pain episodes seem to be triggered with food intake although occasionally they are unrelated to oral intake.  However, her symptoms are vague and limited by her memory problems.  It is unclear if she has presented some worsening diarrhea after she underwent her cholecystectomy, but if so we will consider about salt binder in the future.  I discussed with the patient and the daughter-in-law possibility of proceeding with an EGD and colonoscopy to evaluate organic etiologies for her weight loss which they are agreeable to proceed with, especially given her family history of colon cancer.  If these investigations are negative, we may consider proceeding with a CT angio of the abdomen and pelvis with IV contrast to rule out chronic mesenteric ischemia given her postprandial pain.  I advised her to liberalize her diet and increase the intake of protein shakes.  - Schedule EGD and colonoscopy - Try to increase intake of protein shakes (Boost or Ensure)  to at least two times a day - If negative investigations, may consider CT angio abdomen pelvis with IV contrast  All questions were answered.      66, MD Gastroenterology and Hepatology [facility]

## 2021-08-11 NOTE — Progress Notes (Addendum)
Rajesh Wyss Castaneda, M.D. Gastroenterology & Hepatology [facility] [address] , [place] [address] Primary Care Physician: Shelva Hetzer, Terry G, MD [address] [place] [address]  Referring MD: PCP  Chief Complaint: Abdominal pain, weight loss  History of Present Illness: Jeanette Suarez is a 65 y.o. female possible Alzheimer's disease, depression, family history of colon cancer, hypertension, Sjogren's syndrome, anxiety, who presents for evaluation of abdominal pain and weight loss.  Patient is coming to the office with her daughter in law.  Patient is not the best historian as she is forgetful about some details.  Patient reports that around January 2023, she presented recurrent episodes of pain in her abdomen diffusely described as a pressure-like pain, along with some stabbing pain episodes in between in both of her flanks. She does not have any nighttimes symptoms. She believes that after she had her cholecystectomy, he pain actually worsened. Has not noticed any specific type of food or activity that makes her symptoms worse, but states she has noticed some of the pain episodes start 10 minutes after having a meal. She does not avoid eating to avoid the pain. Family member also endorses she has pain sometimes even if she does not eat.  She is concerned as she has lost significant amount of weight. She has lost 12 lb since February 2023. She has not changed the amount of food that she was eating prior to her surgery.  The patient denies having any nausea, vomiting, fever, chills, hematochezia, melena, hematemesis, diarrhea, jaundice, pruritus.  The patient was endorsing previous episodes of abdominal pain in the right upper quadrant for which she was seen by general surgery on 02/08/2021.  She underwent a laparoscopic cholecystectomy on 02/13/2021. Has not noticed any difference in the  amount or consistency of the bowel movements since she  had her cholecystectomy.  The family member (daughter-in-law) reached me at the end of the visit and stated that her mother-in-law was having problems with memory with a possible diagnosis of Alzheimer's disease.  She states that she is concerned that she does not endorse all her symptoms accurately as she has boys she was taking Imodium in the past for diarrhea.  She was advised to stop taking it but she is not sure if she stopped taking the medication.  I noticed during the visit that the patient could not remember some of the questions I asked her.  Most recent blood work-up from 05/12/2021 showed CMP with normal electrolytes, creatinine 0.97 and BUN of 7, ALT 12, AST 14, alkaline phosphatase 79, total bili 0.6.  TSH 1.1, TIBC 352, iron 70, iron saturation 20%, ferritin 25, CBC with hemoglobin of 14.3, WBC 5.5 and platelets of 255.  CT of the abdomen and pelvis with IV contrast that was performed prior to her cholecystectomy showed stable cholelithiasis and hepatic acidosis with a 2 cm hemangioma.  Last EGD: 07/22/2017, there was a small erosion at the GE junction, 2 cm hiatal hernia, 2 small sessile polyps (fundic gland polyp), normal duodenum. Last Colonoscopy: 07/22/2017, normal terminal ileum, presence of external hemorrhoids.  Advised to repeat colonoscopy in 5 years.  FHx: neg for any gastrointestinal/liver disease, two sisters had a history of colon cancer at 32 and 40s. Social: neg smoking, alcohol or illicit drug use Surgical: hysterectomy and choelcystectomy  Past Medical History: Past Medical History:  Diagnosis Date   Anemia    Anxiety    Depression    Family hx of   colon cancer    GERD (gastroesophageal reflux disease)    Headache    History of hiatal hernia    Hypertension    Sjogren's disease (HCC)     Past Surgical History: Past Surgical History:  Procedure Laterality Date   ABDOMINAL HYSTERECTOMY     BIOPSY  07/22/2017   Procedure: BIOPSY;  Surgeon: Malissa Hippo, MD;   [facility];  Service: Endoscopy;;  gastric polyp   BUNIONECTOMY Bilateral 1992   CHOLECYSTECTOMY N/A 02/13/2021   Procedure: LAPAROSCOPIC CHOLECYSTECTOMY;  Surgeon: Lucretia Roers, MD;  Location: [facility];  Service: General;  Laterality: N/A;   COLONOSCOPY N/A 07/22/2017   Procedure: COLONOSCOPY;  Surgeon: Malissa Hippo, MD;  [facility];  Service: Endoscopy;  Laterality: N/A;   ESOPHAGOGASTRODUODENOSCOPY N/A 07/22/2017   Procedure: ESOPHAGOGASTRODUODENOSCOPY (EGD);  Surgeon: Malissa Hippo, MD;  [facility];  Service: Endoscopy;  Laterality: N/A;  7:30   eye Bilateral    lasix    Family History: Family History  Problem Relation Age of Onset   Hypertension Mother    Colon cancer Sister     Social History: Social History   Tobacco Use  Smoking Status Never  Smokeless Tobacco Never   Social History   Substance and Sexual Activity  Alcohol Use No   Social History   Substance and Sexual Activity  Drug Use No    Allergies: Allergies  Allergen Reactions   Ace Inhibitors Cough   Sulfa Antibiotics Swelling, Rash and Cough    Swelling to hands and lips.    Medications: Current Outpatient Medications  Medication Sig Dispense Refill   ALPRAZolam (XANAX) 0.5 MG tablet Take 0.25 mg by mouth 2 (two) times daily as needed.      Artificial Tear Solution (GENTEAL TEARS) 0.1-0.2-0.3 % SOLN Apply 1 drop to eye daily as needed (both eyes for dry eyes).     ascorbic acid (VITAMIN C) 500 MG tablet Take 500 mg by mouth daily.     buPROPion (WELLBUTRIN XL) 150 MG 24 hr tablet Take 150 mg by mouth daily.     cevimeline (EVOXAC) 30 MG capsule Take 30 mg by mouth 2 (two) times daily.      Coenzyme Q10 (COQ10 PO) Take by mouth daily.     pantoprazole (PROTONIX) 40 MG tablet Take 40 mg by mouth daily.     DOTTI 0.075 MG/24HR Place 1 patch onto the skin every 14 (fourteen) days. (Patient not taking: Reported on 08/11/2021)     iron polysaccharides  (NIFEREX) 150 MG capsule Take 1 capsule by mouth daily. (Patient not taking: Reported on 08/11/2021)     ondansetron (ZOFRAN) 4 MG tablet Take 1 tablet (4 mg total) by mouth every 8 (eight) hours as needed. (Patient not taking: Reported on 08/11/2021) 30 tablet 1   oxyCODONE (ROXICODONE) 5 MG immediate release tablet Take 1 tablet (5 mg total) by mouth every 4 (four) hours as needed for severe pain or breakthrough pain. (Patient not taking: Reported on 08/11/2021) 20 tablet 0   rosuvastatin (CRESTOR) 5 MG tablet Take 5 mg by mouth daily. (Patient not taking: Reported on 08/11/2021)     sertraline (ZOLOFT) 100 MG tablet Take 200 mg by mouth daily.  (Patient not taking: Reported on 08/11/2021)     No current facility-administered medications for this visit.    Review of Systems: GENERAL: negative for malaise, night sweats HEENT: No changes in hearing or vision, no nose bleeds or other  nasal problems. NECK: Negative for lumps, goiter, pain and significant neck swelling RESPIRATORY: Negative for cough, wheezing CARDIOVASCULAR: Negative for chest pain, leg swelling, palpitations, orthopnea GI: SEE HPI MUSCULOSKELETAL: Negative for joint pain or swelling, back pain, and muscle pain. SKIN: Negative for lesions, rash PSYCH: Negative for sleep disturbance, mood disorder and recent psychosocial stressors. HEMATOLOGY Negative for prolonged bleeding, bruising easily, and swollen nodes. ENDOCRINE: Negative for cold or heat intolerance, polyuria, polydipsia and goiter. NEURO: negative for tremor, gait imbalance, syncope and seizures. The remainder of the review of systems is noncontributory.   Physical Exam: BP 123/79 (BP Location: Left Arm, Patient Position: Sitting, Cuff Size: Normal)   Pulse (!) 114   Temp 98.1 F (36.7 C) (Oral)   Ht 5\' 2"  (1.575 m)   Wt 113 lb 4.8 oz (51.4 kg)   BMI 20.72 kg/m  GENERAL: The patient is AO x3, in no acute distress. Underweight. HEENT: Head is normocephalic and  atraumatic. EOMI are intact. Mouth is well hydrated and without lesions. NECK: Supple. No masses LUNGS: Clear to auscultation. No presence of rhonchi/wheezing/rales. Adequate chest expansion HEART: RRR, normal s1 and s2. ABDOMEN: Soft, nontender, no guarding, no peritoneal signs, and nondistended. BS +. No masses. EXTREMITIES: Without any cyanosis, clubbing, rash, lesions or edema. NEUROLOGIC: AOx3, no focal motor deficit. SKIN: no jaundice, no rashes   Imaging/Labs: as above  I personally reviewed and interpreted the available labs, imaging and endoscopic files.  Impression and Plan: Jeanette Suarez is a 66 y.o. female possible Alzheimer's disease, depression, family history of colon cancer, hypertension, Sjogren's syndrome, anxiety, who presents for evaluation of abdominal pain and weight loss.  The patient has presented progressive weight loss that has been unintentional despite the fact that she endorses having an adequate oral intake.  Her abdominal pain episodes seem to be triggered with food intake although occasionally they are unrelated to oral intake.  However, her symptoms are vague and limited by her memory problems.  It is unclear if she has presented some worsening diarrhea after she underwent her cholecystectomy, but if so we will consider about salt binder in the future.  I discussed with the patient and the daughter-in-law possibility of proceeding with an EGD and colonoscopy to evaluate organic etiologies for her weight loss which they are agreeable to proceed with, especially given her family history of colon cancer.  If these investigations are negative, we may consider proceeding with a CT angio of the abdomen and pelvis with IV contrast to rule out chronic mesenteric ischemia given her postprandial pain.  I advised her to liberalize her diet and increase the intake of protein shakes.  - Schedule EGD and colonoscopy - Try to increase intake of protein shakes (Boost or Ensure)  to at least two times a day - If negative investigations, may consider CT angio abdomen pelvis with IV contrast  All questions were answered.      66, MD Gastroenterology and Hepatology [facility]

## 2021-08-11 NOTE — Telephone Encounter (Signed)
Jeanette Suarez Ann Bellarae Lizer, CMA  ?

## 2021-08-11 NOTE — Patient Instructions (Addendum)
Schedule EGD and colonoscopy Try to increase intake of protein shakes (Boost or Ensure) to at least two times a day Keep record for the occurrence of diarrhea If negative investigations, may consider CT angio abdomen pelvis with IV contrast

## 2021-08-14 ENCOUNTER — Encounter (INDEPENDENT_AMBULATORY_CARE_PROVIDER_SITE_OTHER): Payer: Self-pay

## 2021-09-07 ENCOUNTER — Ambulatory Visit (HOSPITAL_COMMUNITY): Payer: 59 | Admitting: Anesthesiology

## 2021-09-07 ENCOUNTER — Encounter (HOSPITAL_COMMUNITY): Payer: Self-pay | Admitting: Gastroenterology

## 2021-09-07 ENCOUNTER — Ambulatory Visit (HOSPITAL_BASED_OUTPATIENT_CLINIC_OR_DEPARTMENT_OTHER): Payer: 59 | Admitting: Anesthesiology

## 2021-09-07 ENCOUNTER — Encounter (HOSPITAL_COMMUNITY)
Admission: RE | Disposition: A | Discharge status: Home / Self Care | Payer: Self-pay | Source: Home / Self Care | Attending: Gastroenterology

## 2021-09-07 ENCOUNTER — Ambulatory Visit (HOSPITAL_COMMUNITY)
Admission: RE | Admit: 2021-09-07 | Discharge: 2021-09-07 | Disposition: A | Discharge status: Home / Self Care | Payer: 59 | Attending: Gastroenterology | Admitting: Gastroenterology

## 2021-09-07 ENCOUNTER — Other Ambulatory Visit: Payer: Self-pay

## 2021-09-07 DIAGNOSIS — I10 Essential (primary) hypertension: Secondary | ICD-10-CM

## 2021-09-07 DIAGNOSIS — Z681 Body mass index (BMI) 19 or less, adult: Secondary | ICD-10-CM | POA: Insufficient documentation

## 2021-09-07 DIAGNOSIS — F32A Depression, unspecified: Secondary | ICD-10-CM | POA: Insufficient documentation

## 2021-09-07 DIAGNOSIS — K2970 Gastritis, unspecified, without bleeding: Secondary | ICD-10-CM | POA: Insufficient documentation

## 2021-09-07 DIAGNOSIS — D649 Anemia, unspecified: Secondary | ICD-10-CM | POA: Insufficient documentation

## 2021-09-07 DIAGNOSIS — K449 Diaphragmatic hernia without obstruction or gangrene: Secondary | ICD-10-CM | POA: Diagnosis not present

## 2021-09-07 DIAGNOSIS — R69 Illness, unspecified: Secondary | ICD-10-CM | POA: Diagnosis not present

## 2021-09-07 DIAGNOSIS — F419 Anxiety disorder, unspecified: Secondary | ICD-10-CM | POA: Insufficient documentation

## 2021-09-07 DIAGNOSIS — Z800 Family history of malignant neoplasm of digestive organs: Secondary | ICD-10-CM | POA: Insufficient documentation

## 2021-09-07 DIAGNOSIS — K621 Rectal polyp: Secondary | ICD-10-CM | POA: Insufficient documentation

## 2021-09-07 DIAGNOSIS — R1013 Epigastric pain: Secondary | ICD-10-CM | POA: Insufficient documentation

## 2021-09-07 DIAGNOSIS — K219 Gastro-esophageal reflux disease without esophagitis: Secondary | ICD-10-CM | POA: Diagnosis not present

## 2021-09-07 DIAGNOSIS — K3189 Other diseases of stomach and duodenum: Secondary | ICD-10-CM | POA: Diagnosis not present

## 2021-09-07 DIAGNOSIS — K635 Polyp of colon: Secondary | ICD-10-CM | POA: Diagnosis not present

## 2021-09-07 DIAGNOSIS — M3500 Sicca syndrome, unspecified: Secondary | ICD-10-CM | POA: Insufficient documentation

## 2021-09-07 DIAGNOSIS — R413 Other amnesia: Secondary | ICD-10-CM | POA: Diagnosis not present

## 2021-09-07 DIAGNOSIS — R109 Unspecified abdominal pain: Secondary | ICD-10-CM

## 2021-09-07 DIAGNOSIS — K2950 Unspecified chronic gastritis without bleeding: Secondary | ICD-10-CM | POA: Diagnosis not present

## 2021-09-07 DIAGNOSIS — R634 Abnormal weight loss: Secondary | ICD-10-CM | POA: Diagnosis not present

## 2021-09-07 HISTORY — PX: COLONOSCOPY WITH PROPOFOL: SHX5780

## 2021-09-07 HISTORY — PX: BIOPSY: SHX5522

## 2021-09-07 HISTORY — PX: ESOPHAGOGASTRODUODENOSCOPY (EGD) WITH PROPOFOL: SHX5813

## 2021-09-07 LAB — HM COLONOSCOPY

## 2021-09-07 SURGERY — COLONOSCOPY WITH PROPOFOL
Anesthesia: General

## 2021-09-07 MED ORDER — PHENYLEPHRINE HCL (PRESSORS) 10 MG/ML IV SOLN
INTRAVENOUS | Status: DC | PRN
  Administered 2021-09-07: 80 ug via INTRAVENOUS

## 2021-09-07 MED ORDER — LACTATED RINGERS IV SOLN: INTRAVENOUS | Status: DC

## 2021-09-07 MED ORDER — PROPOFOL 500 MG/50ML IV EMUL
INTRAVENOUS | Status: DC | PRN
  Administered 2021-09-07: 75 ug/kg/min via INTRAVENOUS

## 2021-09-07 MED ORDER — PROPOFOL 10 MG/ML IV BOLUS
INTRAVENOUS | Status: DC | PRN
  Administered 2021-09-07: 50 mg via INTRAVENOUS
  Administered 2021-09-07: 40 mg via INTRAVENOUS
  Administered 2021-09-07: 100 mg via INTRAVENOUS

## 2021-09-07 NOTE — Anesthesia Procedure Notes (Signed)
Date/Time: 09/07/2021 11:31 AM  Performed by: Franco Nones, CRNAPre-anesthesia Checklist: Patient identified, Emergency Drugs available, Suction available, Timeout performed and Patient being monitored Patient Re-evaluated:Patient Re-evaluated prior to induction Oxygen Delivery Method: Nasal Cannula

## 2021-09-07 NOTE — Interval H&P Note (Signed)
History and Physical Interval Note:  09/07/2021 10:49 AM  Jeanette Suarez  has presented today for surgery, with the diagnosis of Abdominal Pain Unintentional weight loss.  The various methods of treatment have been discussed with the patient and family. After consideration of risks, benefits and other options for treatment, the patient has consented to  Procedure(s) with comments: COLONOSCOPY WITH PROPOFOL (N/A) - 1050 ASA 2 ESOPHAGOGASTRODUODENOSCOPY (EGD) WITH PROPOFOL (N/A) as a surgical intervention.  The patient's history has been reviewed, patient examined, no change in status, stable for surgery.  I have reviewed the patient's chart and labs.  Questions were answered to the patient's satisfaction.     Katrinka Blazing Mayorga

## 2021-09-07 NOTE — Anesthesia Preprocedure Evaluation (Signed)
Anesthesia Evaluation  Patient identified by MRN, date of birth, ID band Patient awake    Reviewed: Allergy & Precautions, H&P , NPO status , Patient's Chart, lab work & pertinent test results, reviewed documented beta blocker date and time   Airway Mallampati: II  TM Distance: >3 FB Neck ROM: full    Dental no notable dental hx.    Pulmonary neg pulmonary ROS,    Pulmonary exam normal breath sounds clear to auscultation       Cardiovascular Exercise Tolerance: Good hypertension, negative cardio ROS   Rhythm:regular Rate:Normal     Neuro/Psych  Headaches, PSYCHIATRIC DISORDERS Anxiety Depression    GI/Hepatic Neg liver ROS, hiatal hernia, GERD  Medicated,  Endo/Other  negative endocrine ROS  Renal/GU negative Renal ROS  negative genitourinary   Musculoskeletal   Abdominal   Peds  Hematology  (+) Blood dyscrasia, anemia ,   Anesthesia Other Findings Sjogrens  Reproductive/Obstetrics negative OB ROS                             Anesthesia Physical Anesthesia Plan  ASA: 3  Anesthesia Plan: General   Post-op Pain Management:    Induction:   PONV Risk Score and Plan:   Airway Management Planned:   Additional Equipment:   Intra-op Plan:   Post-operative Plan:   Informed Consent: I have reviewed the patients History and Physical, chart, labs and discussed the procedure including the risks, benefits and alternatives for the proposed anesthesia with the patient or authorized representative who has indicated his/her understanding and acceptance.     Dental Advisory Given  Plan Discussed with: CRNA  Anesthesia Plan Comments:         Anesthesia Quick Evaluation

## 2021-09-07 NOTE — Discharge Instructions (Addendum)
You are being discharged to home.  Resume your previous diet.  We are waiting for your pathology results.  Your physician has recommended a repeat colonoscopy for surveillance based on pathology results.  Schedule CT angio abdomen and pelvis with IV contrast.     Office will contact you

## 2021-09-07 NOTE — Op Note (Signed)
[facility] [place] Patient Name: Jeanette Suarez Procedure Date: 09/07/2021 11:22 AM MRN: [id number] Date of Birth: April 14, 1955 Attending MD: Katrinka Blazing ,  CSN: [id number] Age: 66 Admit Type: Outpatient Procedure:                Colonoscopy Indications:              Abdominal pain Providers:                Katrinka Blazing, Sheran Fava, Kristine L.                            Jessee Avers, Technician, Lennice Sites Technician,                            Technician, Jannett Celestine, RN Referring MD:              Medicines:                Monitored Anesthesia Care Complications:            No immediate complications. Estimated Blood Loss:     Estimated blood loss: none. Procedure:                Pre-Anesthesia Assessment:                           - Prior to the procedure, a History and Physical                            was performed, and patient medications, allergies                            and sensitivities were reviewed. The patient's                            tolerance of previous anesthesia was reviewed.                           - The risks and benefits of the procedure and the                            sedation options and risks were discussed with the                            patient. All questions were answered and informed                            consent was obtained.                           After obtaining informed consent, the colonoscope                            was passed under direct vision. Throughout the                            procedure, the patient's blood pressure, pulse, and  oxygen saturations were monitored continuously. The                            [id number]-HQ190L ([id number]) scope was introduced through                            the anus and advanced to the the cecum, identified                            by appendiceal orifice and ileocecal valve. The                            colonoscopy was performed without  difficulty. The                            patient tolerated the procedure well. The quality                            of the bowel preparation was good. Scope In: 11:48:55 AM Scope Out: 12:15:10 PM Scope Withdrawal Time: 0 hours 20 minutes 35 seconds  Total Procedure Duration: 0 hours 26 minutes 15 seconds  Findings:      The perianal and digital rectal examinations were normal.      Two sessile polyps were found in the sigmoid colon and transverse colon.       The polyps were 3 to 5 mm in size. These polyps were removed with a cold       snare. Resection and retrieval were complete.      The retroflexed view of the distal rectum and anal verge was normal and       showed no anal or rectal abnormalities. Impression:               - Two 3 to 5 mm polyps in the sigmoid colon and in                            the transverse colon, removed with a cold snare.                            Resected and retrieved.                           - The distal rectum and anal verge are normal on                            retroflexion view. Moderate Sedation:      Per Anesthesia Care Recommendation:           - Discharge patient to home (ambulatory).                           - Resume previous diet.                           - Await pathology results.                           -  Repeat colonoscopy for surveillance based on                            pathology results.                           - Schedule CT angio abdomen and pelvis with IV                            contrast. Procedure Code(s):        --- Professional ---                           [phone], Colonoscopy, flexible; with removal of                            tumor(s), polyp(s), or other lesion(s) by snare                            technique Diagnosis Code(s):        --- Professional ---                           K63.5, Polyp of colon                           R10.9, Unspecified abdominal pain CPT copyright 2019 American Medical  Association. All rights reserved. The codes documented in this report are preliminary and upon coder review may  be revised to meet current compliance requirements. Katrinka Blazing, MD Katrinka Blazing,  09/07/2021 12:23:11 PM This report has been signed electronically. Number of Addenda: 0

## 2021-09-07 NOTE — Transfer of Care (Signed)
Immediate Anesthesia Transfer of Care Note  Patient: Jeanette Suarez  Procedure(s) Performed: COLONOSCOPY WITH PROPOFOL ESOPHAGOGASTRODUODENOSCOPY (EGD) WITH PROPOFOL BIOPSY  Patient Location: Endoscopy Unit  Anesthesia Type:General  Level of Consciousness: drowsy  Airway & Oxygen Therapy: Patient Spontanous Breathing  Post-op Assessment: Report given to RN and Post -op Vital signs reviewed and stable  Post vital signs: Reviewed and stable  Last Vitals:  Vitals Value Taken Time  BP 120/56 09/07/21 1221  Temp 36.6 C 09/07/21 1221  Pulse 85 09/07/21 1221  Resp 15 09/07/21 1221  SpO2 100 % 09/07/21 1221    Last Pain:  Vitals:   09/07/21 1221  TempSrc: Oral  PainSc: 0-No pain         Complications: No notable events documented.

## 2021-09-07 NOTE — Op Note (Signed)
[facility] Patient Name: Jeanette Suarez Procedure Date: 09/07/2021 11:18 AM MRN: [id number] Date of Birth: [birth year] Attending MD: Katrinka Blazing ,  CSN: [id number] Age: 66 Admit Type: Outpatient Procedure:                Upper GI endoscopy Indications:              Epigastric abdominal pain, Weight loss Providers:                Katrinka Blazing, Sheran Fava, Kristine L.                            Jessee Avers, Technician, Roseanne Kaufman,                            Pensions consultant Referring MD:              Medicines:                Monitored Anesthesia Care Complications:            No immediate complications. Estimated Blood Loss:     Estimated blood loss: none. Procedure:                Pre-Anesthesia Assessment:                           - Prior to the procedure, a History and Physical                            was performed, and patient medications, allergies                            and sensitivities were reviewed. The patient's                            tolerance of previous anesthesia was reviewed.                           - The risks and benefits of the procedure and the                            sedation options and risks were discussed with the                            patient. All questions were answered and informed                            consent was obtained.                           After obtaining informed consent, the endoscope was                            passed under direct vision. Throughout the                            procedure, the patient's blood pressure, pulse, and  oxygen saturations were monitored continuously. The                            [id number]-[id number] ([id number]) scope was introduced through the                            mouth, and advanced to the second part of duodenum.                            The upper GI endoscopy was accomplished without                            difficulty. The patient  tolerated the procedure                            well. Findings:      The esophagus was normal.      Patchy mildly erythematous mucosa without bleeding was found in the       gastric body. Imaging was performed using white light and narrow band       imaging to visualize the mucosa. Biopsies were taken with a cold forceps       for histology and H. pylori.      The examined duodenum was normal. Impression:               - Normal esophagus.                           - Erythematous mucosa in the gastric body. Biopsied.                           - Normal examined duodenum. Moderate Sedation:      Per Anesthesia Care Recommendation:           - Discharge patient to home (ambulatory).                           - Resume previous diet.                           - Await pathology results.                           - If negative path, will proceed with CTA abdomen                            and pelvis Procedure Code(s):        --- Professional ---                           [id number], Esophagogastroduodenoscopy, flexible,                            transoral; with biopsy, single or multiple Diagnosis Code(s):        --- Professional ---                           K31.89, Other  diseases of stomach and duodenum                           R10.13, Epigastric pain                           R63.4, Abnormal weight loss CPT copyright 2019 American Medical Association. All rights reserved. The codes documented in this report are preliminary and upon coder review may  be revised to meet current compliance requirements. Katrinka Blazing, MD Katrinka Blazing,  09/07/2021 12:18:49 PM This report has been signed electronically. Number of Addenda: 0

## 2021-09-08 ENCOUNTER — Other Ambulatory Visit (INDEPENDENT_AMBULATORY_CARE_PROVIDER_SITE_OTHER): Payer: Self-pay

## 2021-09-08 NOTE — Anesthesia Postprocedure Evaluation (Signed)
Anesthesia Post Note  Patient: Jeanette Suarez  Procedure(s) Performed: COLONOSCOPY WITH PROPOFOL ESOPHAGOGASTRODUODENOSCOPY (EGD) WITH PROPOFOL BIOPSY  Patient location during evaluation: Phase II Anesthesia Type: General Level of consciousness: awake Pain management: pain level controlled Vital Signs Assessment: post-procedure vital signs reviewed and stable Respiratory status: spontaneous breathing and respiratory function stable Cardiovascular status: blood pressure returned to baseline and stable Postop Assessment: no headache and no apparent nausea or vomiting Anesthetic complications: no Comments: Late entry   No notable events documented.   Last Vitals:  Vitals:   09/07/21 1221 09/07/21 1225  BP: (!) 120/56 (!) 120/56  Pulse: 85 85  Resp: 15 16  Temp: 36.6 C   SpO2: 100% 100%    Last Pain:  Vitals:   09/07/21 1225  TempSrc:   PainSc: 0-No pain                 Windell Norfolk

## 2021-09-12 ENCOUNTER — Other Ambulatory Visit (INDEPENDENT_AMBULATORY_CARE_PROVIDER_SITE_OTHER): Payer: Self-pay

## 2021-09-12 ENCOUNTER — Telehealth (INDEPENDENT_AMBULATORY_CARE_PROVIDER_SITE_OTHER): Payer: Self-pay

## 2021-09-12 ENCOUNTER — Encounter (INDEPENDENT_AMBULATORY_CARE_PROVIDER_SITE_OTHER): Payer: Self-pay | Admitting: *Deleted

## 2021-09-12 DIAGNOSIS — R1084 Generalized abdominal pain: Secondary | ICD-10-CM

## 2021-09-12 NOTE — Telephone Encounter (Signed)
Thanks

## 2021-09-12 NOTE — Telephone Encounter (Signed)
Ct is scheduled on Saturday 09/23/21 and patients daughter is aware

## 2021-09-12 NOTE — Telephone Encounter (Signed)
-----   Message from Dolores Frame, MD sent at 09/07/2021 12:24 PM EDT ----- Gordy Savers,   Can you please schedule a CT angio abdomen pelvis with IV contrast? Dx: postprandial abdominal pain, rule out mesenteric ischemia.  Thanks,  Katrinka Blazing, MD Gastroenterology and Hepatology [facility]

## 2021-09-14 ENCOUNTER — Encounter (HOSPITAL_COMMUNITY): Payer: Self-pay | Admitting: Gastroenterology

## 2021-09-17 ENCOUNTER — Ambulatory Visit (HOSPITAL_BASED_OUTPATIENT_CLINIC_OR_DEPARTMENT_OTHER): Payer: 59

## 2021-09-18 LAB — SURGICAL PATHOLOGY

## 2021-09-23 ENCOUNTER — Ambulatory Visit (HOSPITAL_BASED_OUTPATIENT_CLINIC_OR_DEPARTMENT_OTHER)
Admission: RE | Admit: 2021-09-23 | Discharge: 2021-09-23 | Disposition: A | Discharge status: Home / Self Care | Payer: 59 | Source: Ambulatory Visit | Attending: Gastroenterology | Admitting: Gastroenterology

## 2021-09-23 DIAGNOSIS — R109 Unspecified abdominal pain: Secondary | ICD-10-CM | POA: Diagnosis not present

## 2021-09-23 DIAGNOSIS — R1084 Generalized abdominal pain: Secondary | ICD-10-CM | POA: Insufficient documentation

## 2021-09-23 MED ORDER — IOHEXOL 350 MG/ML SOLN
75.0000 mL | Freq: Once | INTRAVENOUS | Status: AC | PRN
  Administered 2021-09-23: 141 mL via INTRAVENOUS

## 2021-10-04 DIAGNOSIS — D500 Iron deficiency anemia secondary to blood loss (chronic): Secondary | ICD-10-CM | POA: Diagnosis not present

## 2021-10-04 DIAGNOSIS — R413 Other amnesia: Secondary | ICD-10-CM | POA: Diagnosis not present

## 2021-10-04 DIAGNOSIS — R69 Illness, unspecified: Secondary | ICD-10-CM | POA: Diagnosis not present

## 2021-10-04 DIAGNOSIS — Z0001 Encounter for general adult medical examination with abnormal findings: Secondary | ICD-10-CM | POA: Diagnosis not present

## 2021-10-04 DIAGNOSIS — M3501 Sicca syndrome with keratoconjunctivitis: Secondary | ICD-10-CM | POA: Diagnosis not present

## 2021-10-04 DIAGNOSIS — F0280 Dementia in other diseases classified elsewhere without behavioral disturbance: Secondary | ICD-10-CM | POA: Diagnosis not present

## 2021-10-04 DIAGNOSIS — I10 Essential (primary) hypertension: Secondary | ICD-10-CM | POA: Diagnosis not present

## 2021-10-04 DIAGNOSIS — G309 Alzheimer's disease, unspecified: Secondary | ICD-10-CM | POA: Diagnosis not present

## 2021-10-04 DIAGNOSIS — E440 Moderate protein-calorie malnutrition: Secondary | ICD-10-CM | POA: Diagnosis not present

## 2021-10-04 DIAGNOSIS — D510 Vitamin B12 deficiency anemia due to intrinsic factor deficiency: Secondary | ICD-10-CM | POA: Diagnosis not present

## 2021-10-04 DIAGNOSIS — E7849 Other hyperlipidemia: Secondary | ICD-10-CM | POA: Diagnosis not present

## 2021-10-04 DIAGNOSIS — R4582 Worries: Secondary | ICD-10-CM | POA: Diagnosis not present

## 2021-10-04 DIAGNOSIS — K2100 Gastro-esophageal reflux disease with esophagitis, without bleeding: Secondary | ICD-10-CM | POA: Diagnosis not present

## 2021-10-19 ENCOUNTER — Ambulatory Visit (INDEPENDENT_AMBULATORY_CARE_PROVIDER_SITE_OTHER): Payer: 59 | Admitting: Gastroenterology

## 2021-10-26 ENCOUNTER — Ambulatory Visit (INDEPENDENT_AMBULATORY_CARE_PROVIDER_SITE_OTHER): Payer: 59 | Admitting: Gastroenterology

## 2021-10-26 ENCOUNTER — Encounter (INDEPENDENT_AMBULATORY_CARE_PROVIDER_SITE_OTHER): Payer: Self-pay | Admitting: Gastroenterology

## 2021-10-26 VITALS — BP 117/69 | HR 90 | Temp 98.1°F | Ht 64.5 in | Wt 116.1 lb

## 2021-10-26 DIAGNOSIS — R1013 Epigastric pain: Secondary | ICD-10-CM | POA: Diagnosis not present

## 2021-10-26 DIAGNOSIS — G8929 Other chronic pain: Secondary | ICD-10-CM

## 2021-10-26 MED ORDER — OMEPRAZOLE 40 MG PO CPDR: 40.0000 mg | DELAYED_RELEASE_CAPSULE | Freq: Every day | ORAL | 3 refills | Status: DC

## 2021-10-26 NOTE — Progress Notes (Addendum)
Referring Provider: Richardean Chimera, MD Primary Care Physician:  Richardean Chimera, MD Primary GI Physician: Levon Hedger   Chief Complaint  Patient presents with   Follow-up    Patient here today for a follow up, she reports to have some tenderness in upper abdomen. Has had some bouts of diarrhea, which she takes imodium for prn.Denies any other current issue.   HPI:   Jeanette Suarez is a 66 y.o. female with past medical history of possible Alzheimer's disease, depression, family history of colon cancer, hypertension, Sjogren's syndrome, anxiety  Patient presenting today for follow up.  Last seen august 2023 for weight loss, 12 lbs down since February. Having previous RUQ Pain, seen by general surgery on 02/08/2021.  She underwent a laparoscopic cholecystectomy on 02/13/2021. Has not noticed any difference in the  amount or consistency of the bowel movements since she had her cholecystectomy. Recommended proceed with EGD and Colonoscopy and increase protein shakes for weight loss.  CT angio a/p on 09/23/2021 - no presence of significant atherosclerosis in vasculature.  Present:  Patient reports she has ongoing epigastric pain that seems to come on after eating. She also has some diarrhea at times usually depending on what she eats. She denies any nausea or vomiting. Denies rectal bleeding or melena. Weight is 116 today, 113 lbs in august. Husband reports she seems to be eating well. Does two meals a day per day and some snacks in between. She cannot tolerate protein shakes as she does not do well with consistency or tastes of milk products. She is doing some protein bars. She denies heartburn or acid regurgitation.   Last EGD: 09/07/21- Normal esophagus. - Erythematous mucosa in the gastric body. Biopsied-chronic inflammation, no h pylori - Normal examined duodenum. Colonoscopy 09/07/21 - Two 3 to 5 mm polyps in the sigmoid colon and in the transverse colon, removed with a cold snare. Resected and  retrieved. (Hyperplastic) - The distal rectum and anal verge are normal on retroflexion view.  Repeat colon in 10 years  Past Medical History:  Diagnosis Date   Anemia    Anxiety    Depression    Family hx of colon cancer    GERD (gastroesophageal reflux disease)    Headache    History of hiatal hernia    Hypertension    Sjogren's disease (HCC)     Past Surgical History:  Procedure Laterality Date   ABDOMINAL HYSTERECTOMY     BIOPSY  07/22/2017   Procedure: BIOPSY;  Surgeon: Malissa Hippo, MD;  Location: [facility];  Service: Endoscopy;;  gastric polyp   BIOPSY  09/07/2021   Procedure: BIOPSY;  Surgeon: Dolores Frame, MD;  Location: [facility];  Service: Gastroenterology;;   Arbutus Leas Bilateral 1992   CHOLECYSTECTOMY N/A 02/13/2021   Procedure: LAPAROSCOPIC CHOLECYSTECTOMY;  Surgeon: Lucretia Roers, MD;  Location: [facility];  Service: General;  Laterality: N/A;   COLONOSCOPY N/A 07/22/2017   Procedure: COLONOSCOPY;  Surgeon: Malissa Hippo, MD;  Location: [facility];  Service: Endoscopy;  Laterality: N/A;   COLONOSCOPY WITH PROPOFOL N/A 09/07/2021   Procedure: COLONOSCOPY WITH PROPOFOL;  Surgeon: Dolores Frame, MD;  Location: [facility];  Service: Gastroenterology;  Laterality: N/A;  1050 ASA 2   ESOPHAGOGASTRODUODENOSCOPY N/A 07/22/2017   Procedure: ESOPHAGOGASTRODUODENOSCOPY (EGD);  Surgeon: Malissa Hippo, MD;  Location: [facility];  Service: Endoscopy;  Laterality: N/A;  7:30   ESOPHAGOGASTRODUODENOSCOPY (EGD) WITH PROPOFOL N/A 09/07/2021   Procedure:  ESOPHAGOGASTRODUODENOSCOPY (EGD) WITH PROPOFOL;  Surgeon: Montez Morita, Quillian Quince, MD;  Location: [facility];  Service: Gastroenterology;  Laterality: N/A;   eye Bilateral    lasix    Current Outpatient Medications  Medication Sig Dispense Refill   ALPRAZolam (XANAX) 0.5 MG tablet Take 0.25 mg by mouth 2 (two) times daily as needed.      Artificial Tear Solution (GENTEAL  TEARS) 0.1-0.2-0.3 % SOLN Apply 1 drop to eye daily as needed (both eyes for dry eyes).     ascorbic acid (VITAMIN C) 500 MG tablet Take 500 mg by mouth daily. As needed per patient.     cevimeline (EVOXAC) 30 MG capsule Take 30 mg by mouth 2 (two) times daily.      buPROPion (WELLBUTRIN XL) 150 MG 24 hr tablet Take 150 mg by mouth daily. (Patient not taking: Reported on 10/26/2021)     sertraline (ZOLOFT) 100 MG tablet Take 200 mg by mouth daily.  (Patient not taking: Reported on 08/11/2021)     No current facility-administered medications for this visit.    Allergies as of 10/26/2021 - Review Complete 10/26/2021  Allergen Reaction Noted   Ace inhibitors Cough 02/03/2014   Sulfa antibiotics Swelling, Rash, and Cough 02/03/2014    Family History  Problem Relation Age of Onset   Hypertension Mother    Colon cancer Sister     Social History   Socioeconomic History   Marital status: Married    Spouse name: Not on file   Number of children: 2   Years of education: 14   Highest education level: Not on file  Occupational History   Not on file  Tobacco Use   Smoking status: Never   Smokeless tobacco: Never  Vaping Use   Vaping Use: Never used  Substance and Sexual Activity   Alcohol use: No   Drug use: No   Sexual activity: Not on file  Other Topics Concern   Not on file  Social History Narrative   Right handed   Drinks caffeine   One story home   Social Determinants of Health   Financial Resource Strain: Not on file  Food Insecurity: Not on file  Transportation Needs: Not on file  Physical Activity: Not on file  Stress: Not on file  Social Connections: Not on file   Review of systems General: negative for malaise, night sweats, fever, chills, weight loss Neck: Negative for lumps, goiter, pain and significant neck swelling Resp: Negative for cough, wheezing, dyspnea at rest CV: Negative for chest pain, leg swelling, palpitations, orthopnea GI: denies melena,  hematochezia, nausea, vomiting, constipation, dysphagia, odyonophagia, early satiety or unintentional weight loss. +epigastric pain +occasional diarrhea MSK: Negative for joint pain or swelling, back pain, and muscle pain. Derm: Negative for itching or rash Psych: Denies depression, anxiety, memory loss, confusion. No homicidal or suicidal ideation.  Heme: Negative for prolonged bleeding, bruising easily, and swollen nodes. Endocrine: Negative for cold or heat intolerance, polyuria, polydipsia and goiter. Neuro: negative for tremor, gait imbalance, syncope and seizures. The remainder of the review of systems is noncontributory.  Physical Exam: BP 117/69 (BP Location: Left Arm, Patient Position: Sitting, Cuff Size: Small)   Pulse 90   Temp 98.1 F (36.7 C) (Oral)   Ht 5' 4.5" (1.638 m)   Wt 116 lb 1.6 oz (52.7 kg)   BMI 19.62 kg/m  General:   Alert and oriented. No distress noted. Pleasant and cooperative.  Head:  Normocephalic and atraumatic. Eyes:  Conjuctiva clear  without scleral icterus. Mouth:  Oral mucosa pink and moist. Good dentition. No lesions. Heart: Normal rate and rhythm, s1 and s2 heart sounds present.  Lungs: Clear lung sounds in all lobes. Respirations equal and unlabored. Abdomen:  +BS, soft, non-tender and non-distended. No rebound or guarding. No HSM or masses noted. Derm: No palmar erythema or jaundice Msk:  Symmetrical without gross deformities. Normal posture. Extremities:  Without edema. Neurologic:  Alert and  oriented x4 Psych:  Alert and cooperative. Normal mood and affect.  Invalid input(s): "6 MONTHS"   ASSESSMENT: SKYLAR FLYNT is a 66 y.o. female presenting today for follow up of upper abdominal pain.  Continues to have some upper abdominal pain, mostly located in epigastric region, occurring usually after eating. Recent EGD with chronic inflammation of the stomach, no H pylori. Imaging was unremarkable. She denies GERD symptoms. Feels that appetite  is good and she is eating well. No rectal bleeding, melena, nausea or vomiting. Given chronic inflammation on EGD, query if this is contributing to her epigastric discomfort. Will start omeprazole 40mg  once daily to see if this provides relief of her symptoms. Advised to implement reflux precautions.   Weight has actually increased by about 3 pounds since last visit. She was encouraged to eat high protein diet and continue with protein bars as she does not tolerate the protein shakes.    PLAN:  Start omeprazole 40mg  daily  2. High protein diet 3. Reflux precautions   All questions were answered, patient verbalized understanding and is in agreement with plan as outlined above.   Follow Up: 3 months   Tamario Heal L. , MSN, APRN, AGNP-C Adult-Gerontology Nurse Practitioner [facility]

## 2021-10-26 NOTE — Patient Instructions (Signed)
It was nice to meet you! We will try a low dose acid suppressor, omeprazole 40mg , take this once daily in the mornings 30 minutes prior to eating  Be mindful that greasy, spicy, fried, citrus foods, caffeine, carbonated drinks, chocolate and alcohol can increase acid production.   Continue with high protein diet, adding protein bars/shakes in between meals to maintain weight  Follow up 3 months

## 2021-11-03 DIAGNOSIS — R413 Other amnesia: Secondary | ICD-10-CM | POA: Diagnosis not present

## 2021-11-03 DIAGNOSIS — K2100 Gastro-esophageal reflux disease with esophagitis, without bleeding: Secondary | ICD-10-CM | POA: Diagnosis not present

## 2021-11-03 DIAGNOSIS — E7849 Other hyperlipidemia: Secondary | ICD-10-CM | POA: Diagnosis not present

## 2021-11-03 DIAGNOSIS — E440 Moderate protein-calorie malnutrition: Secondary | ICD-10-CM | POA: Diagnosis not present

## 2021-11-03 DIAGNOSIS — D510 Vitamin B12 deficiency anemia due to intrinsic factor deficiency: Secondary | ICD-10-CM | POA: Diagnosis not present

## 2021-11-03 DIAGNOSIS — D500 Iron deficiency anemia secondary to blood loss (chronic): Secondary | ICD-10-CM | POA: Diagnosis not present

## 2021-11-03 DIAGNOSIS — J301 Allergic rhinitis due to pollen: Secondary | ICD-10-CM | POA: Diagnosis not present

## 2021-11-03 DIAGNOSIS — F0280 Dementia in other diseases classified elsewhere without behavioral disturbance: Secondary | ICD-10-CM | POA: Diagnosis not present

## 2021-11-03 DIAGNOSIS — R69 Illness, unspecified: Secondary | ICD-10-CM | POA: Diagnosis not present

## 2021-11-03 DIAGNOSIS — G309 Alzheimer's disease, unspecified: Secondary | ICD-10-CM | POA: Diagnosis not present

## 2021-11-03 DIAGNOSIS — I10 Essential (primary) hypertension: Secondary | ICD-10-CM | POA: Diagnosis not present

## 2021-11-03 DIAGNOSIS — M3501 Sicca syndrome with keratoconjunctivitis: Secondary | ICD-10-CM | POA: Diagnosis not present

## 2021-11-22 DIAGNOSIS — Z01419 Encounter for gynecological examination (general) (routine) without abnormal findings: Secondary | ICD-10-CM | POA: Diagnosis not present

## 2021-11-22 DIAGNOSIS — Z780 Asymptomatic menopausal state: Secondary | ICD-10-CM | POA: Diagnosis not present

## 2021-11-22 DIAGNOSIS — Z130 Encounter for screening for diseases of the blood and blood-forming organs and certain disorders involving the immune mechanism: Secondary | ICD-10-CM | POA: Diagnosis not present

## 2021-11-22 DIAGNOSIS — Z1231 Encounter for screening mammogram for malignant neoplasm of breast: Secondary | ICD-10-CM | POA: Diagnosis not present

## 2022-01-23 ENCOUNTER — Encounter (INDEPENDENT_AMBULATORY_CARE_PROVIDER_SITE_OTHER): Payer: Self-pay | Admitting: Gastroenterology

## 2022-01-23 ENCOUNTER — Ambulatory Visit (INDEPENDENT_AMBULATORY_CARE_PROVIDER_SITE_OTHER): Payer: 59 | Admitting: Gastroenterology

## 2022-01-23 VITALS — BP 137/78 | HR 101 | Temp 97.9°F | Ht 64.5 in | Wt 129.0 lb

## 2022-01-23 DIAGNOSIS — K219 Gastro-esophageal reflux disease without esophagitis: Secondary | ICD-10-CM | POA: Diagnosis not present

## 2022-01-23 DIAGNOSIS — R634 Abnormal weight loss: Secondary | ICD-10-CM

## 2022-01-23 MED ORDER — OMEPRAZOLE 40 MG PO CPDR: 40.0000 mg | DELAYED_RELEASE_CAPSULE | Freq: Every day | ORAL | 1 refills | Status: DC

## 2022-01-23 NOTE — Progress Notes (Addendum)
Referring Provider: Richardean Chimera, MD Primary Care Physician:  Richardean Chimera, MD Primary GI Physician: Levon Hedger   Chief Complaint  Patient presents with   Gastroesophageal Reflux    Patient arrives with husband Bill for a Follow up on GERD. Takes omeprazole in the mornings and reports doing well with med.    HPI:   Jeanette Suarez is a 67 y.o. female with past medical history of  possible Alzheimer's disease, depression, family history of colon cancer, hypertension, Sjogren's syndrome, anxiety   Patient presenting today for follow up of GERD/weight loss.   History: Having previous RUQ Pain, seen by general surgery on 02/08/2021.  She underwent a laparoscopic cholecystectomy on 02/13/2021. Has not noticed any difference in the  amount or consistency of the bowel movements since she had her cholecystectomy. Recommended proceed with EGD and Colonoscopy and increase protein shakes for weight loss. CT angio a/p on 09/23/2021 - no presence of significant atherosclerosis in vasculature. At last visit weight was up about 3-4 lbs, eating better, did not tolerate protein shakes.   Recommended start omeprazole 40 mg daily, high protein diet, reflux precautions.  Present: Weight is up to 129 lbs today from 116 lbs in October.   States that she is feeling much better since starting omeprazole 40mg  daily. Denies any dysphagia. She is not doing protein shakes now as appetite is better and she is eating well. She does an occasional protein bar for a snack. She notes continued intermittent diarrhea, mostly when she eats something she should not. No abdominal pain, rectal bleeding, melena. Moving her bowels on a regular basis. Does not take anything for her diarrhea. No nausea or vomiting.   Last EGD: 09/07/21- Normal esophagus. - Erythematous mucosa in the gastric body. Biopsied-chronic inflammation, no h pylori - Normal examined duodenum. Colonoscopy 09/07/21 - Two 3 to 5 mm polyps in the sigmoid colon  and in the transverse colon, removed with a cold snare. Resected and retrieved. (Hyperplastic) - The distal rectum and anal verge are normal on retroflexion view.   Repeat colon in 10 years  Past Medical History:  Diagnosis Date   Anemia    Anxiety    Depression    Family hx of colon cancer    GERD (gastroesophageal reflux disease)    Headache    History of hiatal hernia    Hypertension    Sjogren's disease (HCC)     Past Surgical History:  Procedure Laterality Date   ABDOMINAL HYSTERECTOMY     BIOPSY  07/22/2017   Procedure: BIOPSY;  Surgeon: 07/24/2017, MD;  [facility];  Service: Endoscopy;;  gastric polyp   BIOPSY  09/07/2021   Procedure: BIOPSY;  Surgeon: 09/09/2021, MD;  [facility];  Service: Gastroenterology;;   Dolores Frame Bilateral 1992   CHOLECYSTECTOMY N/A 02/13/2021   Procedure: LAPAROSCOPIC CHOLECYSTECTOMY;  Surgeon: 04/13/2021, MD;  Location: [facility];  Service: General;  Laterality: N/A;   COLONOSCOPY N/A 07/22/2017   Procedure: COLONOSCOPY;  Surgeon: 07/24/2017, MD;  [facility];  Service: Endoscopy;  Laterality: N/A;   COLONOSCOPY WITH PROPOFOL N/A 09/07/2021   Procedure: COLONOSCOPY WITH PROPOFOL;  Surgeon: 09/09/2021, MD;  [facility];  Service: Gastroenterology;  Laterality: N/A;  1050 ASA 2   ESOPHAGOGASTRODUODENOSCOPY N/A 07/22/2017   Procedure: ESOPHAGOGASTRODUODENOSCOPY (EGD);  Surgeon: 07/24/2017, MD;  [facility];  Service: Endoscopy;  Laterality: N/A;  7:30  ESOPHAGOGASTRODUODENOSCOPY (EGD) WITH PROPOFOL N/A 09/07/2021   Procedure: ESOPHAGOGASTRODUODENOSCOPY (EGD) WITH PROPOFOL;  Surgeon: Harvel Quale, MD;  [facility];  Service: Gastroenterology;  Laterality: N/A;   eye Bilateral    lasix    Current Outpatient Medications  Medication Sig Dispense Refill   ALPRAZolam (XANAX) 0.5 MG tablet Take 0.25 mg by mouth 2  (two) times daily as needed.      Artificial Tear Solution (GENTEAL TEARS) 0.1-0.2-0.3 % SOLN Apply 1 drop to eye daily as needed (both eyes for dry eyes).     ascorbic acid (VITAMIN C) 500 MG tablet Take 500 mg by mouth daily. As needed per patient.     cevimeline (EVOXAC) 30 MG capsule Take 30 mg by mouth 2 (two) times daily.      omeprazole (PRILOSEC) 40 MG capsule Take 1 capsule (40 mg total) by mouth daily. 30 capsule 3   buPROPion (WELLBUTRIN XL) 150 MG 24 hr tablet Take 150 mg by mouth daily. (Patient not taking: Reported on 10/26/2021)     sertraline (ZOLOFT) 100 MG tablet Take 200 mg by mouth daily.  (Patient not taking: Reported on 08/11/2021)     No current facility-administered medications for this visit.    Allergies as of 01/23/2022 - Review Complete 01/23/2022  Allergen Reaction Noted   Ace inhibitors Cough 02/03/2014   Sulfa antibiotics Swelling, Rash, and Cough 02/03/2014    Family History  Problem Relation Age of Onset   Hypertension Mother    Colon cancer Sister     Social History   Socioeconomic History   Marital status: Married    Spouse name: Not on file   Number of children: 2   Years of education: 14   Highest education level: Not on file  Occupational History   Not on file  Tobacco Use   Smoking status: Never    Passive exposure: Never   Smokeless tobacco: Never  Vaping Use   Vaping Use: Never used  Substance and Sexual Activity   Alcohol use: No   Drug use: No   Sexual activity: Not on file  Other Topics Concern   Not on file  Social History Narrative   Right handed   Drinks caffeine   One story home   Social Determinants of Health   Financial Resource Strain: Not on file  Food Insecurity: Not on file  Transportation Needs: Not on file  Physical Activity: Not on file  Stress: Not on file  Social Connections: Not on file   Review of systems General: negative for malaise, night sweats, fever, chills, weight loss Neck: Negative for  lumps, goiter, pain and significant neck swelling Resp: Negative for cough, wheezing, dyspnea at rest CV: Negative for chest pain, leg swelling, palpitations, orthopnea GI: denies melena, hematochezia, nausea, vomiting, constipation, dysphagia, odyonophagia, early satiety or unintentional weight loss. +diarrhea  MSK: Negative for joint pain or swelling, back pain, and muscle pain. Derm: Negative for itching or rash Psych: Denies depression, anxiety, memory loss, confusion. No homicidal or suicidal ideation.  Heme: Negative for prolonged bleeding, bruising easily, and swollen nodes. Endocrine: Negative for cold or heat intolerance, polyuria, polydipsia and goiter. Neuro: negative for tremor, gait imbalance, syncope and seizures. The remainder of the review of systems is noncontributory.  Physical Exam: BP 137/78 (BP Location: Left Arm, Patient Position: Sitting, Cuff Size: Normal)   Pulse (!) 101   Temp 97.9 F (36.6 C) (Oral)   Ht 5' 4.5" (1.638 m)   Wt 129  lb (58.5 kg)   BMI 21.80 kg/m  General:   Alert and oriented. No distress noted. Pleasant and cooperative.  Head:  Normocephalic and atraumatic. Eyes:  Conjuctiva clear without scleral icterus. Mouth:  Oral mucosa pink and moist. Good dentition. No lesions. Heart: Normal rate and rhythm, s1 and s2 heart sounds present.  Lungs: Clear lung sounds in all lobes. Respirations equal and unlabored. Abdomen:  +BS, soft, non-tender and non-distended. No rebound or guarding. No HSM or masses noted. Derm: No palmar erythema or jaundice Msk:  Symmetrical without gross deformities. Normal posture. Extremities:  Without edema. Neurologic:  Alert and  oriented x4 Psych:  Alert and cooperative. Normal mood and affect.  Invalid input(s): "6 MONTHS"   ASSESSMENT: Jeanette Suarez is a 67 y.o. female presenting today for follow up of GERD and weight loss.   GERD is well managed with omeprazole 40mg  daily, no dysphagia. Denies  epigastric/abdominal pain. Will continue with PPI daily, reflux precautions to include avoid greasy, spicy, fried, citrus foods, and be mindful that caffeine, carbonated drinks, chocolate and alcohol can increase reflux symptoms. Stay upright 2-3 hours after eating, prior to lying down and avoid eating late in the evenings.   Weight is up about 13 pounds since October. She is eating well. Not doing any protein shakes, does an occasional protein bar for snack. Encouraged to continue to liberalize her diet and eat what she can tolerate.   PLAN:  Continue PPI daily, reflux precautions  2.  Liberalize diet  3. Reflux precautions  All questions were answered, patient verbalized understanding and is in agreement with plan as outlined above.   Follow Up: 6 months   Abelardo Seidner L. Alver Sorrow, MSN, APRN, AGNP-C Adult-Gerontology Nurse Practitioner [facility]  I have reviewed the note and agree with the APP's assessment as described in this progress note  Maylon Peppers, MD Gastroenterology and Hepatology [facility]

## 2022-01-23 NOTE — Patient Instructions (Addendum)
It was nice to see you! I'm glad you are doing well, continue to liberalize diet and eat what you tolerate Continue omeprazole 40mg  daily   Follow up 6 months

## 2022-03-01 DIAGNOSIS — D510 Vitamin B12 deficiency anemia due to intrinsic factor deficiency: Secondary | ICD-10-CM | POA: Diagnosis not present

## 2022-03-01 DIAGNOSIS — G309 Alzheimer's disease, unspecified: Secondary | ICD-10-CM | POA: Diagnosis not present

## 2022-03-01 DIAGNOSIS — M3501 Sicca syndrome with keratoconjunctivitis: Secondary | ICD-10-CM | POA: Diagnosis not present

## 2022-03-01 DIAGNOSIS — R69 Illness, unspecified: Secondary | ICD-10-CM | POA: Diagnosis not present

## 2022-03-01 DIAGNOSIS — Z23 Encounter for immunization: Secondary | ICD-10-CM | POA: Diagnosis not present

## 2022-03-01 DIAGNOSIS — R413 Other amnesia: Secondary | ICD-10-CM | POA: Diagnosis not present

## 2022-03-01 DIAGNOSIS — K2100 Gastro-esophageal reflux disease with esophagitis, without bleeding: Secondary | ICD-10-CM | POA: Diagnosis not present

## 2022-03-01 DIAGNOSIS — E782 Mixed hyperlipidemia: Secondary | ICD-10-CM | POA: Diagnosis not present

## 2022-03-01 DIAGNOSIS — D500 Iron deficiency anemia secondary to blood loss (chronic): Secondary | ICD-10-CM | POA: Diagnosis not present

## 2022-03-01 DIAGNOSIS — J301 Allergic rhinitis due to pollen: Secondary | ICD-10-CM | POA: Diagnosis not present

## 2022-03-01 DIAGNOSIS — I10 Essential (primary) hypertension: Secondary | ICD-10-CM | POA: Diagnosis not present

## 2022-03-01 DIAGNOSIS — M26609 Unspecified temporomandibular joint disorder, unspecified side: Secondary | ICD-10-CM | POA: Diagnosis not present

## 2022-03-05 ENCOUNTER — Other Ambulatory Visit (INDEPENDENT_AMBULATORY_CARE_PROVIDER_SITE_OTHER): Payer: Self-pay

## 2022-03-05 DIAGNOSIS — K219 Gastro-esophageal reflux disease without esophagitis: Secondary | ICD-10-CM

## 2022-03-05 MED ORDER — OMEPRAZOLE 40 MG PO CPDR: 40.0000 mg | DELAYED_RELEASE_CAPSULE | Freq: Every day | ORAL | 1 refills | Status: DC

## 2022-03-06 ENCOUNTER — Telehealth (INDEPENDENT_AMBULATORY_CARE_PROVIDER_SITE_OTHER): Payer: Self-pay

## 2022-03-06 NOTE — Telephone Encounter (Signed)
Omeprazole 40 mg approved by [employer] [facility] from 03/05/2022-03/06/2023

## 2022-06-26 DIAGNOSIS — K2100 Gastro-esophageal reflux disease with esophagitis, without bleeding: Secondary | ICD-10-CM | POA: Diagnosis not present

## 2022-06-26 DIAGNOSIS — E7849 Other hyperlipidemia: Secondary | ICD-10-CM | POA: Diagnosis not present

## 2022-06-26 DIAGNOSIS — Z1329 Encounter for screening for other suspected endocrine disorder: Secondary | ICD-10-CM | POA: Diagnosis not present

## 2022-06-26 DIAGNOSIS — I10 Essential (primary) hypertension: Secondary | ICD-10-CM | POA: Diagnosis not present

## 2022-06-26 DIAGNOSIS — G309 Alzheimer's disease, unspecified: Secondary | ICD-10-CM | POA: Diagnosis not present

## 2022-07-03 DIAGNOSIS — M26609 Unspecified temporomandibular joint disorder, unspecified side: Secondary | ICD-10-CM | POA: Diagnosis not present

## 2022-07-03 DIAGNOSIS — F331 Major depressive disorder, recurrent, moderate: Secondary | ICD-10-CM | POA: Diagnosis not present

## 2022-07-03 DIAGNOSIS — D500 Iron deficiency anemia secondary to blood loss (chronic): Secondary | ICD-10-CM | POA: Diagnosis not present

## 2022-07-03 DIAGNOSIS — R4582 Worries: Secondary | ICD-10-CM | POA: Diagnosis not present

## 2022-07-03 DIAGNOSIS — G309 Alzheimer's disease, unspecified: Secondary | ICD-10-CM | POA: Diagnosis not present

## 2022-07-03 DIAGNOSIS — D510 Vitamin B12 deficiency anemia due to intrinsic factor deficiency: Secondary | ICD-10-CM | POA: Diagnosis not present

## 2022-07-03 DIAGNOSIS — Z6823 Body mass index (BMI) 23.0-23.9, adult: Secondary | ICD-10-CM | POA: Diagnosis not present

## 2022-07-03 DIAGNOSIS — I10 Essential (primary) hypertension: Secondary | ICD-10-CM | POA: Diagnosis not present

## 2022-07-03 DIAGNOSIS — J301 Allergic rhinitis due to pollen: Secondary | ICD-10-CM | POA: Diagnosis not present

## 2022-07-03 DIAGNOSIS — M3501 Sicca syndrome with keratoconjunctivitis: Secondary | ICD-10-CM | POA: Diagnosis not present

## 2022-07-03 DIAGNOSIS — K2100 Gastro-esophageal reflux disease with esophagitis, without bleeding: Secondary | ICD-10-CM | POA: Diagnosis not present

## 2022-07-03 DIAGNOSIS — E7849 Other hyperlipidemia: Secondary | ICD-10-CM | POA: Diagnosis not present

## 2022-07-24 ENCOUNTER — Encounter (INDEPENDENT_AMBULATORY_CARE_PROVIDER_SITE_OTHER): Payer: Self-pay | Admitting: Gastroenterology

## 2022-07-24 ENCOUNTER — Ambulatory Visit (INDEPENDENT_AMBULATORY_CARE_PROVIDER_SITE_OTHER): Payer: 59 | Admitting: Gastroenterology

## 2022-09-18 DIAGNOSIS — Z6824 Body mass index (BMI) 24.0-24.9, adult: Secondary | ICD-10-CM | POA: Diagnosis not present

## 2022-09-18 DIAGNOSIS — R300 Dysuria: Secondary | ICD-10-CM | POA: Diagnosis not present

## 2022-10-30 ENCOUNTER — Ambulatory Visit (INDEPENDENT_AMBULATORY_CARE_PROVIDER_SITE_OTHER): Payer: Medicare Other | Admitting: Gastroenterology

## 2022-10-30 ENCOUNTER — Encounter (INDEPENDENT_AMBULATORY_CARE_PROVIDER_SITE_OTHER): Payer: Self-pay | Admitting: Gastroenterology

## 2022-10-30 ENCOUNTER — Ambulatory Visit (HOSPITAL_COMMUNITY)
Admission: RE | Admit: 2022-10-30 | Discharge: 2022-10-30 | Disposition: A | Discharge status: Home / Self Care | Payer: Medicare Other | Source: Ambulatory Visit | Attending: Gastroenterology | Admitting: Gastroenterology

## 2022-10-30 VITALS — BP 136/82 | HR 98 | Temp 97.3°F | Ht 64.5 in | Wt 137.7 lb

## 2022-10-30 DIAGNOSIS — K5900 Constipation, unspecified: Secondary | ICD-10-CM | POA: Insufficient documentation

## 2022-10-30 DIAGNOSIS — R1084 Generalized abdominal pain: Secondary | ICD-10-CM | POA: Diagnosis not present

## 2022-10-30 DIAGNOSIS — R1915 Other abnormal bowel sounds: Secondary | ICD-10-CM | POA: Diagnosis not present

## 2022-10-30 DIAGNOSIS — R140 Abdominal distension (gaseous): Secondary | ICD-10-CM | POA: Diagnosis not present

## 2022-10-30 DIAGNOSIS — R109 Unspecified abdominal pain: Secondary | ICD-10-CM | POA: Diagnosis not present

## 2022-10-30 NOTE — Patient Instructions (Addendum)
Please go over to [facility] hospital to have an xray of your abdomen, I am looking to see if there is an abundance of stool in your colon and to rule out obstruction I will give you a call once I have these results If you have worsening pain, vomiting, fevers, please make me aware   Follow up 1 month  It was a pleasure to see you today. I want to create trusting relationships with patients and provide genuine, compassionate, and quality care. I truly value your feedback! please be on the lookout for a survey regarding your visit with me today. I appreciate your input about our visit and your time in completing this!    Nashonda Limberg L. Jeanmarie Hubert, MSN, APRN, AGNP-C Adult-Gerontology Nurse Practitioner [facility]astroenter[facility] at [facility]

## 2022-10-30 NOTE — Progress Notes (Addendum)
Referring Provider: Richardean Chimera, MD Primary Care Physician:  Richardean Chimera, MD Primary GI Physician: Dr. Levon Hedger   Chief Complaint  Patient presents with   Abdominal Pain    Patient here today due to having issues with abdominal pain. Patient has some issues with bloating. She is also having issues with constipation. She is not taking anything currently for the issues.   HPI:   Jeanette Suarez is a 67 y.o. female with past medical history of possible Alzheimer's disease, depression, family history of colon cancer, hypertension, Sjogren's syndrome, anxiety  Patient presenting today for follow up of abdominal pain  Last seen January 2024, at that time weight up to 129 lbs, from 116 lbs. Feeling better on omeprazole 40mg  daily. Doing protein shakes and appetite improved. Continued intermittent diarrhea with certain foods.   Recommended to continue with PPI daily, liberalize diet, reflux precautions  Present: She complains of abdominal pain for the past few weeks off and on. States she is having some constipation, not moving her bowels well at all, unsure of last time she had a BM. She is not taking anything for constipation at this time. Drinking plenty of water. Denies any new medications. Appetite is decent. No nausea or vomiting. Notes that abdominal pain tends to worsen when she eats. She notes some bloating and fullness in her abdomen.  Patient denies melena, hematochezia, diarrhea,  dysphagia, odyonophagia, early satiety or weight loss.   No acid regurgitation or heartburn. No longer taking omeprazole   Last EGD: 09/07/21- Normal esophagus. - Erythematous mucosa in the gastric body. Biopsied-chronic inflammation, no h pylori - Normal examined duodenum. Colonoscopy 09/07/21 - Two 3 to 5 mm polyps in the sigmoid colon and in the transverse colon, removed with a cold snare. Resected and retrieved. (Hyperplastic) - The distal rectum and anal verge are normal on retroflexion  view.   Repeat colon in 10 years   Past Medical History:  Diagnosis Date   Anemia    Anxiety    Depression    Family hx of colon cancer    GERD (gastroesophageal reflux disease)    Headache    History of hiatal hernia    Hypertension    Sjogren's disease (HCC)     Past Surgical History:  Procedure Laterality Date   ABDOMINAL HYSTERECTOMY     BIOPSY  07/22/2017   Procedure: BIOPSY;  Surgeon: Malissa Hippo, MD;  [facility];  Service: Endoscopy;;  gastric polyp   BIOPSY  09/07/2021   Procedure: BIOPSY;  Surgeon: Dolores Frame, MD;  [facility];  Service: Gastroenterology;;   Arbutus Leas Bilateral 1992   CHOLECYSTECTOMY N/A 02/13/2021   Procedure: LAPAROSCOPIC CHOLECYSTECTOMY;  Surgeon: Lucretia Roers, MD;  [facility]: [facility];  Service: General;  Laterality: N/A;   COLONOSCOPY N/A 07/22/2017   Procedure: COLONOSCOPY;  Surgeon: Malissa Hippo, MD;  [facility];  Service: Endoscopy;  Laterality: N/A;   COLONOSCOPY WITH PROPOFOL N/A 09/07/2021   Procedure: COLONOSCOPY WITH PROPOFOL;  Surgeon: Dolores Frame, MD;  [facility];  Service: Gastroenterology;  Laterality: N/A;  1050 ASA 2   ESOPHAGOGASTRODUODENOSCOPY N/A 07/22/2017   Procedure: ESOPHAGOGASTRODUODENOSCOPY (EGD);  Surgeon: Malissa Hippo, MD;  [facility];  Service: Endoscopy;  Laterality: N/A;  7:30   ESOPHAGOGASTRODUODENOSCOPY (EGD) WITH PROPOFOL N/A 09/07/2021   Procedure: ESOPHAGOGASTRODUODENOSCOPY (EGD) WITH PROPOFOL;  Surgeon: Dolores Frame, MD;  [facility];  Service: Gastroenterology;  Laterality: N/A;   eye Bilateral    lasix    Current Outpatient Medications  Medication Sig Dispense Refill   ALPRAZolam (XANAX) 0.5 MG tablet Take 0.25 mg by mouth 2 (two) times daily as needed.      Artificial Tear Solution (GENTEAL TEARS) 0.1-0.2-0.3 % SOLN Apply 1 drop to eye daily as needed (both eyes for dry eyes).      ascorbic acid (VITAMIN C) 500 MG tablet Take 500 mg by mouth daily. As needed per patient.     cevimeline (EVOXAC) 30 MG capsule Take 30 mg by mouth 2 (two) times daily.      No current facility-administered medications for this visit.    Allergies as of 10/30/2022 - Review Complete 10/30/2022  Allergen Reaction Noted   Ace inhibitors Cough 02/03/2014   Sulfa antibiotics Swelling, Rash, and Cough 02/03/2014   Review of systems General: negative for malaise, night sweats, fever, chills, weight loss Neck: Negative for lumps, goiter, pain and significant neck swelling Resp: Negative for cough, wheezing, dyspnea at rest CV: Negative for chest pain, leg swelling, palpitations, orthopnea GI: denies melena, hematochezia, nausea, vomiting, diarrhea,dysphagia, odyonophagia, early satiety or unintentional weight loss. +constipation +abdominal pain  The remainder of the review of systems is noncontributory.  Physical Exam: BP 136/82 (BP Location: Left Arm, Patient Position: Sitting, Cuff Size: Normal)   Pulse 98   Temp (!) 97.3 F (36.3 C) (Temporal)   Ht 5' 4.5" (1.638 m)   Wt 137 lb 11.2 oz (62.5 kg)   BMI 23.27 kg/m  General:   Alert. No distress noted. Pleasant and cooperative.  Head:  Normocephalic and atraumatic. Eyes:  Conjuctiva clear without scleral icterus. Mouth:  Oral mucosa pink and moist. Good dentition. No lesions. Heart: Normal rate and rhythm, s1 and s2 heart sounds present.  Lungs: Clear lung sounds in all lobes. Respirations equal and unlabored. Abdomen:  hypoactive BS. soft, non-tender and non-distended. No rebound or guarding. No HSM or masses noted. Neurologic:  Alert and oriented to person Psych:  Alert and cooperative. Normal mood and affect.  Invalid input(s): "6 MONTHS"   ASSESSMENT: Jeanette Suarez is a 67 y.o. female presenting today for abdominal pain and constipation  Patient with generalized abdominal pain and constipation, unsure of last BM.  Denies  any recent new medications or changes in medications.  Water intake is reportedly good.  Appetite is decent and she denies any nausea or vomiting.  She has some fullness and bloating in her abdomen.  Abdominal exam reveals hypoactive bowel sounds.  Suspect she is suffering from significant constipation, however will obtain abdominal x-ray to rule out bowel obstruction.  If x-ray shows significant stool burden will send colon prep to get her cleaned out and start MiraLAX thereafter.  Will check TSH and CMP to rule out underlying causes of constipation  PLAN:  Check CMP, TSH  2. Obtain abdominal xray 3. Colon prep if significant stool burden and miralax thereafter 4. Continue good water intake, diet high in fruits, veggies, whole grains  All questions were answered, patient verbalized understanding and is in agreement with plan as outlined above.   Follow Up: 1 month  Vaudie Engebretsen L. Jeanmarie Hubert, MSN, APRN, AGNP-C Adult-Gerontology Nurse Practitioner [facility]  I have reviewed the note and agree with the APP's assessment as described in this progress note  Katrinka Blazing, MD Gastroenterology and Hepatology [facility] Gastroenterology

## 2022-11-07 DIAGNOSIS — L304 Erythema intertrigo: Secondary | ICD-10-CM | POA: Diagnosis not present

## 2022-11-07 DIAGNOSIS — R300 Dysuria: Secondary | ICD-10-CM | POA: Diagnosis not present

## 2022-11-07 DIAGNOSIS — Z6824 Body mass index (BMI) 24.0-24.9, adult: Secondary | ICD-10-CM | POA: Diagnosis not present

## 2022-11-12 DIAGNOSIS — Z0000 Encounter for general adult medical examination without abnormal findings: Secondary | ICD-10-CM | POA: Diagnosis not present

## 2022-11-12 DIAGNOSIS — K219 Gastro-esophageal reflux disease without esophagitis: Secondary | ICD-10-CM | POA: Diagnosis not present

## 2022-11-12 DIAGNOSIS — E7849 Other hyperlipidemia: Secondary | ICD-10-CM | POA: Diagnosis not present

## 2022-11-12 DIAGNOSIS — E559 Vitamin D deficiency, unspecified: Secondary | ICD-10-CM | POA: Diagnosis not present

## 2022-11-12 DIAGNOSIS — R5382 Chronic fatigue, unspecified: Secondary | ICD-10-CM | POA: Diagnosis not present

## 2022-11-19 DIAGNOSIS — Z6824 Body mass index (BMI) 24.0-24.9, adult: Secondary | ICD-10-CM | POA: Diagnosis not present

## 2022-11-19 DIAGNOSIS — D500 Iron deficiency anemia secondary to blood loss (chronic): Secondary | ICD-10-CM | POA: Diagnosis not present

## 2022-11-19 DIAGNOSIS — M3501 Sicca syndrome with keratoconjunctivitis: Secondary | ICD-10-CM | POA: Diagnosis not present

## 2022-11-19 DIAGNOSIS — M26609 Unspecified temporomandibular joint disorder, unspecified side: Secondary | ICD-10-CM | POA: Diagnosis not present

## 2022-11-19 DIAGNOSIS — E7849 Other hyperlipidemia: Secondary | ICD-10-CM | POA: Diagnosis not present

## 2022-11-19 DIAGNOSIS — G309 Alzheimer's disease, unspecified: Secondary | ICD-10-CM | POA: Diagnosis not present

## 2022-11-19 DIAGNOSIS — I10 Essential (primary) hypertension: Secondary | ICD-10-CM | POA: Diagnosis not present

## 2022-11-19 DIAGNOSIS — K2100 Gastro-esophageal reflux disease with esophagitis, without bleeding: Secondary | ICD-10-CM | POA: Diagnosis not present

## 2022-11-19 DIAGNOSIS — J301 Allergic rhinitis due to pollen: Secondary | ICD-10-CM | POA: Diagnosis not present

## 2022-11-19 DIAGNOSIS — Z0001 Encounter for general adult medical examination with abnormal findings: Secondary | ICD-10-CM | POA: Diagnosis not present

## 2022-11-19 DIAGNOSIS — D510 Vitamin B12 deficiency anemia due to intrinsic factor deficiency: Secondary | ICD-10-CM | POA: Diagnosis not present

## 2022-12-03 ENCOUNTER — Ambulatory Visit (INDEPENDENT_AMBULATORY_CARE_PROVIDER_SITE_OTHER): Payer: Medicare Other | Admitting: Gastroenterology

## 2022-12-03 ENCOUNTER — Encounter (INDEPENDENT_AMBULATORY_CARE_PROVIDER_SITE_OTHER): Payer: Self-pay | Admitting: Gastroenterology

## 2022-12-03 VITALS — BP 138/77 | HR 91 | Temp 98.6°F | Ht 64.0 in | Wt 139.1 lb

## 2022-12-03 DIAGNOSIS — K5900 Constipation, unspecified: Secondary | ICD-10-CM

## 2022-12-03 NOTE — Patient Instructions (Signed)
Continue MiraLAX half a capful every day.  If worsening pain or constipation, can increase to 1-3 capfuls per day.

## 2022-12-03 NOTE — Progress Notes (Unsigned)
Katrinka Blazing, M.D. Gastroenterology & Hepatology [facility] Gastroenterology [address] [place], [place] [address]  Primary Care Physician: Richardean Chimera, MD [address] [place] [address]  I will communicate my assessment and recommendations to the referring MD via EMR.  Problems: Abdominal pain possibly related to constipation  History of Present Illness: Jeanette Suarez is a 67 y.o. female with past medical history of possible Alzheimer's disease, depression, family history of colon cancer, hypertension, Sjogren's syndrome, anxiety, who presents for follow up of abdominal pain and constipation.  The patient was last seen on 10/30/2022. At that time, the patient had CMP and TSH ordered but she never went to have the labs drawn.  KUB was completely normal although there was a mild amount of stool.  She was advised to start taking MiraLAX every day.  Patient reports that she has felt improvement of her symptoms while taking half a capful of Miralax daily. She states she may have very mild abdominal pain since then. She states that the severity and frequency have significantly decreased. She is now having a BM every day.   The patient denies having any nausea, vomiting, fever, chills, hematochezia, melena, hematemesis, abdominal distention,diarrhea, jaundice, pruritus or weight loss.  Last EGD: 09/07/21- Normal esophagus. - Erythematous mucosa in the gastric body. Biopsied-chronic inflammation, no h pylori - Normal examined duodenum.  Colonoscopy 09/07/21 - Two 3 to 5 mm polyps in the sigmoid colon and in the transverse colon, removed with a cold snare. Resected and retrieved. (Hyperplastic) - The distal rectum and anal verge are normal on retroflexion view.   Repeat colon in 10 years  Past Medical History: Past Medical History:  Diagnosis Date   Anemia    Anxiety    Depression    Family hx of colon cancer    GERD (gastroesophageal reflux  disease)    Headache    History of hiatal hernia    Hypertension    Sjogren's disease (HCC)     Past Surgical History: Past Surgical History:  Procedure Laterality Date   ABDOMINAL HYSTERECTOMY     BIOPSY  07/22/2017   Procedure: BIOPSY;  Surgeon: Malissa Hippo, MD;  [facility];  Service: Endoscopy;;  gastric polyp   BIOPSY  09/07/2021   Procedure: BIOPSY;  Surgeon: Dolores Frame, MD;  [facility];  Service: Gastroenterology;;   Arbutus Leas Bilateral 1992   CHOLECYSTECTOMY N/A 02/13/2021   Procedure: LAPAROSCOPIC CHOLECYSTECTOMY;  Surgeon: Lucretia Roers, MD;  Location: [facility];  Service: General;  Laterality: N/A;   COLONOSCOPY N/A 07/22/2017   Procedure: COLONOSCOPY;  Surgeon: Malissa Hippo, MD;  [facility];  Service: Endoscopy;  Laterality: N/A;   COLONOSCOPY WITH PROPOFOL N/A 09/07/2021   Procedure: COLONOSCOPY WITH PROPOFOL;  Surgeon: Dolores Frame, MD;  [facility];  Service: Gastroenterology;  Laterality: N/A;  1050 ASA 2   ESOPHAGOGASTRODUODENOSCOPY N/A 07/22/2017   Procedure: ESOPHAGOGASTRODUODENOSCOPY (EGD);  Surgeon: Malissa Hippo, MD;  [facility];  Service: Endoscopy;  Laterality: N/A;  7:30   ESOPHAGOGASTRODUODENOSCOPY (EGD) WITH PROPOFOL N/A 09/07/2021   Procedure: ESOPHAGOGASTRODUODENOSCOPY (EGD) WITH PROPOFOL;  Surgeon: Dolores Frame, MD;  [facility];  Service: Gastroenterology;  Laterality: N/A;   eye Bilateral    lasix    Family History: Family History  Problem Relation Age of Onset   Hypertension Mother    Colon cancer Sister     Social  History: Social History   Tobacco Use  Smoking Status Never   Passive exposure: Never  Smokeless Tobacco Never   Social History   Substance and Sexual Activity  Alcohol Use No   Social History   Substance and Sexual Activity  Drug Use No    Allergies: Allergies  Allergen Reactions   Ace  Inhibitors Cough   Sulfa Antibiotics Swelling, Rash and Cough    Swelling to hands and lips.    Medications: Current Outpatient Medications  Medication Sig Dispense Refill   ALPRAZolam (XANAX) 0.5 MG tablet Take 0.25 mg by mouth 2 (two) times daily as needed.      Artificial Tear Solution (GENTEAL TEARS) 0.1-0.2-0.3 % SOLN Apply 1 drop to eye daily as needed (both eyes for dry eyes).     ascorbic acid (VITAMIN C) 500 MG tablet Take 500 mg by mouth daily. As needed per patient.     polyethylene glycol (MIRALAX / GLYCOLAX) 17 g packet Take 17 g by mouth daily as needed.     cevimeline (EVOXAC) 30 MG capsule Take 30 mg by mouth 2 (two) times daily.      No current facility-administered medications for this visit.    Review of Systems: GENERAL: negative for malaise, night sweats HEENT: No changes in hearing or vision, no nose bleeds or other nasal problems. NECK: Negative for lumps, goiter, pain and significant neck swelling RESPIRATORY: Negative for cough, wheezing CARDIOVASCULAR: Negative for chest pain, leg swelling, palpitations, orthopnea GI: SEE HPI MUSCULOSKELETAL: Negative for joint pain or swelling, back pain, and muscle pain. SKIN: Negative for lesions, rash PSYCH: Negative for sleep disturbance, mood disorder and recent psychosocial stressors. HEMATOLOGY Negative for prolonged bleeding, bruising easily, and swollen nodes. ENDOCRINE: Negative for cold or heat intolerance, polyuria, polydipsia and goiter. NEURO: negative for tremor, gait imbalance, syncope and seizures. The remainder of the review of systems is noncontributory.   Physical Exam: BP 138/77 (BP Location: Left Arm, Patient Position: Sitting, Cuff Size: Normal)   Pulse 91   Temp 98.6 F (37 C) (Temporal)   Ht 5\' 4"  (1.626 m)   Wt 139 lb 1.6 oz (63.1 kg)   BMI 23.88 kg/m  GENERAL: The patient is AO x3, in no acute distress. HEENT: Head is normocephalic and atraumatic. EOMI are intact. Mouth is well hydrated  and without lesions. NECK: Supple. No masses LUNGS: Clear to auscultation. No presence of rhonchi/wheezing/rales. Adequate chest expansion HEART: RRR, normal s1 and s2. ABDOMEN: Soft, nontender, no guarding, no peritoneal signs, and nondistended. BS +. No masses. EXTREMITIES: Without any cyanosis, clubbing, rash, lesions or edema. NEUROLOGIC: AOx3, no focal motor deficit. SKIN: no jaundice, no rashes  Imaging/Labs: as above  I personally reviewed and interpreted the available labs, imaging and endoscopic files.  Impression and Plan: Jeanette Suarez is a 67 y.o. female with past medical history of possible Alzheimer's disease, depression, family history of colon cancer, hypertension, Sjogren's syndrome, anxiety, who presents for follow up of abdominal pain and constipation.  The patient has presented significant improvement of her abdominal pain after taking MiraLAX on a regular basis.  Has not presented any other associated symptoms.  I consider her previous pain was  related to constipation, which has fortunately resolved.  She should continue taking MiraLAX on a regular basis.  -Continue MiraLAX half a capful every day.  If worsening pain or constipation, can increase to 1-3 capfuls per day.  All questions were answered.      Katrinka Blazing, MD Gastroenterology  and Hepatology [facility] Gastroenterology

## 2023-01-24 DIAGNOSIS — Z1231 Encounter for screening mammogram for malignant neoplasm of breast: Secondary | ICD-10-CM | POA: Diagnosis not present

## 2023-02-11 ENCOUNTER — Other Ambulatory Visit (INDEPENDENT_AMBULATORY_CARE_PROVIDER_SITE_OTHER): Payer: Self-pay | Admitting: Gastroenterology

## 2023-02-20 DIAGNOSIS — Z6824 Body mass index (BMI) 24.0-24.9, adult: Secondary | ICD-10-CM | POA: Diagnosis not present

## 2023-02-20 DIAGNOSIS — R509 Fever, unspecified: Secondary | ICD-10-CM | POA: Diagnosis not present

## 2023-03-13 DIAGNOSIS — D649 Anemia, unspecified: Secondary | ICD-10-CM | POA: Diagnosis not present

## 2023-03-13 DIAGNOSIS — R5382 Chronic fatigue, unspecified: Secondary | ICD-10-CM | POA: Diagnosis not present

## 2023-03-13 DIAGNOSIS — K219 Gastro-esophageal reflux disease without esophagitis: Secondary | ICD-10-CM | POA: Diagnosis not present

## 2023-03-13 DIAGNOSIS — Z1329 Encounter for screening for other suspected endocrine disorder: Secondary | ICD-10-CM | POA: Diagnosis not present

## 2023-03-13 DIAGNOSIS — I10 Essential (primary) hypertension: Secondary | ICD-10-CM | POA: Diagnosis not present

## 2023-03-13 DIAGNOSIS — E7849 Other hyperlipidemia: Secondary | ICD-10-CM | POA: Diagnosis not present

## 2023-03-20 DIAGNOSIS — E782 Mixed hyperlipidemia: Secondary | ICD-10-CM | POA: Diagnosis not present

## 2023-03-20 DIAGNOSIS — D510 Vitamin B12 deficiency anemia due to intrinsic factor deficiency: Secondary | ICD-10-CM | POA: Diagnosis not present

## 2023-03-20 DIAGNOSIS — I10 Essential (primary) hypertension: Secondary | ICD-10-CM | POA: Diagnosis not present

## 2023-03-20 DIAGNOSIS — Z6824 Body mass index (BMI) 24.0-24.9, adult: Secondary | ICD-10-CM | POA: Diagnosis not present

## 2023-03-20 DIAGNOSIS — E7849 Other hyperlipidemia: Secondary | ICD-10-CM | POA: Diagnosis not present

## 2023-05-11 ENCOUNTER — Other Ambulatory Visit (INDEPENDENT_AMBULATORY_CARE_PROVIDER_SITE_OTHER): Payer: Self-pay | Admitting: Gastroenterology

## 2023-07-10 DIAGNOSIS — R5382 Chronic fatigue, unspecified: Secondary | ICD-10-CM | POA: Diagnosis not present

## 2023-07-10 DIAGNOSIS — E7849 Other hyperlipidemia: Secondary | ICD-10-CM | POA: Diagnosis not present

## 2023-07-10 DIAGNOSIS — D649 Anemia, unspecified: Secondary | ICD-10-CM | POA: Diagnosis not present

## 2023-07-10 DIAGNOSIS — I10 Essential (primary) hypertension: Secondary | ICD-10-CM | POA: Diagnosis not present

## 2023-07-17 DIAGNOSIS — E7849 Other hyperlipidemia: Secondary | ICD-10-CM | POA: Diagnosis not present

## 2023-07-17 DIAGNOSIS — I10 Essential (primary) hypertension: Secondary | ICD-10-CM | POA: Diagnosis not present

## 2023-07-17 DIAGNOSIS — Z6823 Body mass index (BMI) 23.0-23.9, adult: Secondary | ICD-10-CM | POA: Diagnosis not present

## 2023-07-17 DIAGNOSIS — D510 Vitamin B12 deficiency anemia due to intrinsic factor deficiency: Secondary | ICD-10-CM | POA: Diagnosis not present

## 2023-07-17 DIAGNOSIS — E782 Mixed hyperlipidemia: Secondary | ICD-10-CM | POA: Diagnosis not present

## 2023-08-08 ENCOUNTER — Other Ambulatory Visit (INDEPENDENT_AMBULATORY_CARE_PROVIDER_SITE_OTHER): Payer: Self-pay | Admitting: Gastroenterology

## 2023-09-24 ENCOUNTER — Telehealth: Payer: Self-pay

## 2023-09-24 DIAGNOSIS — I10 Essential (primary) hypertension: Secondary | ICD-10-CM

## 2023-10-01 ENCOUNTER — Telehealth: Payer: Self-pay

## 2023-10-04 DIAGNOSIS — M79672 Pain in left foot: Secondary | ICD-10-CM | POA: Diagnosis not present

## 2023-10-04 DIAGNOSIS — Z6823 Body mass index (BMI) 23.0-23.9, adult: Secondary | ICD-10-CM | POA: Diagnosis not present

## 2023-10-07 ENCOUNTER — Other Ambulatory Visit: Payer: Self-pay

## 2023-10-08 DIAGNOSIS — M79672 Pain in left foot: Secondary | ICD-10-CM | POA: Diagnosis not present

## 2023-10-08 NOTE — Patient Outreach (Signed)
 Contacted daughter to check if she was able to contact [facility] to inquire about any in-home aide benefits, she states the [facility] representative told her patient can have 6 hours a week.  But did not mention a preferred agency.  Daughter will call back to make sure [employer] does not have a preferred agency.  Meanwhile, this RNCM will find contact information for [facility] [place]

## 2023-10-08 NOTE — Patient Outreach (Signed)
 Left voicemial with [facility] ([facility] [place], Roselie Nose at [phone],  for guidance on in-home aide utilizing patient's [facility] benefits.

## 2023-10-08 NOTE — Patient Instructions (Signed)
 Visit Information  Thank you for taking time to visit with me today. Please don't hesitate to contact me if I can be of assistance to you before our next scheduled appointment.  Our next appointment is by telephone on Friday, October 3rd at 9:30am. Please call the care guide team at [phone] if you need to cancel or reschedule your appointment.   Following is a copy of your care plan:   Goals Addressed             This Visit's Progress    VBCI RN Care Plan       Problems:  Chronic Disease Management support and education needs related to Dementia  Goal: Over the next 2 months the Caregiver will demonstrate Improved health management independence as evidenced by patient will avoid physical harm          Interventions:   Dementia:  Collaborated with BSW for contact information to [facility] senior resources, Reviewed medications including alprazolam , Emotional Support Provided to patient/caregiver, Consideration of in-home help encouraged , and confirmed husband and daughter want to keep patient in the home.   Patient Self-Care Activities:  Daughter will call [facility] health insurance to inquire if patient has a benefit for in-home aide services.   Plan:  Telephone follow up appointment with care management team member scheduled for:  one week.             Please call the [place]  National Suicide Prevention Lifeline: 208-603-6874 or TTY: 236-274-5204 TTY [phone]) to talk to a trained counselor if you are experiencing a Mental Health or Behavioral Health Crisis or need someone to talk to.  Patient verbalizes understanding of instructions and care plan provided today and agrees to view in MyChart. Active MyChart status and patient understanding of how to access instructions and care plan via MyChart confirmed with patient.     Santana Stamp BSN, CCM [place]  VBCI Population Health RN Care Manager Direct Dial: [phone]  Fax: [phone]\

## 2023-10-08 NOTE — Patient Outreach (Signed)
 Complex Care Management   Visit Note  10/08/2023  Name:  Jeanette Suarez MRN: [id number] DOB: 23-Sep-1955  Situation: Referral received for Complex Care Management related to Alzheimer's, Depression, HTN, Hyperlipidemia. I obtained verbal consent from Caregiver.  Visit completed with daughter, Jeanette Suarez  on the phone  Background:   Past Medical History:  Diagnosis Date   Anemia    Anxiety    Depression    Family hx of colon cancer    GERD (gastroesophageal reflux disease)    Headache    History of hiatal hernia    Hypertension    Sjogren's disease     Assessment: Patient Reported Symptoms:  Cognitive Cognitive Status: Struggling with memory recall, Able to follow simple commands, Requires Assistance Decision Making      Neurological Neurological Review of Symptoms: No symptoms reported    HEENT HEENT Symptoms Reported: No symptoms reported      Cardiovascular Cardiovascular Symptoms Reported: No symptoms reported    Respiratory Respiratory Symptoms Reported: Shortness of breath    Endocrine Endocrine Symptoms Reported: No symptoms reported Is patient diabetic?: No    Gastrointestinal Gastrointestinal Symptoms Reported: No symptoms reported      Genitourinary Genitourinary Symptoms Reported: No symptoms reported    Integumentary Integumentary Symptoms Reported: No symptoms reported    Musculoskeletal Musculoskelatal Symptoms Reviewed: No symptoms reported Additional Musculoskeletal Details: Does not have to use ambulation device currently Musculoskeletal Management Strategies: Adequate rest, Routine screening Musculoskeletal Self-Management Outcome: 4 (good)      Psychosocial Psychosocial Symptoms Reported: Other Other Psychosocial Conditions: Daughter states patient's memory is getting worse, has started to wander, paces the floor, does not want to keep up with hygiene. Husband assists with cooking meals, daughter helps with housekeeping/laundry.  Goal is  to keep patient in the home with possible aide coming in to help with lighthousekeeping, cooking, bathing. Behavioral Management Strategies: Support system Behavioral Health Self-Management Outcome: 3 (uncertain)   Quality of Family Relationships: helpful, involved, supportive Do you feel physically threatened by others?: No    10/08/2023    PHQ2-9 Depression Screening   Little interest or pleasure in doing things    Feeling down, depressed, or hopeless    PHQ-2 - Total Score    Trouble falling or staying asleep, or sleeping too much    Feeling tired or having little energy    Poor appetite or overeating     Feeling bad about yourself - or that you are a failure or have let yourself or your family down    Trouble concentrating on things, such as reading the newspaper or watching television    Moving or speaking so slowly that other people could have noticed.  Or the opposite - being so fidgety or restless that you have been moving around a lot more than usual    Thoughts that you would be better off dead, or hurting yourself in some way    PHQ2-9 Total Score    If you checked off any problems, how difficult have these problems made it for you to do your work, take care of things at home, or get along with other people    Depression Interventions/Treatment      There were no vitals filed for this visit.  Medications Reviewed Today     Reviewed by Lucian Santana LABOR, RN (Registered Nurse) on 10/01/23 at 1050  Med List Status: <None>   Medication Order Taking? Sig Documenting Provider Last Dose Status Informant  ALPRAZolam (XANAX) 0.5  MG tablet [id number]  Take 0.25 mg by mouth 2 (two) times daily as needed.  [provider]  Active Self           Med Note MIRTA MERILEE KATHEE [place] May 21, 2019 10:10 AM) 05/11/2019 #90  Artificial Tear Solution (GENTEAL TEARS) 0.1-0.2-0.3 % SOLN [id number] Yes Apply 1 drop to eye daily as needed (both eyes for dry eyes). [provider]   Active Self  ascorbic acid (VITAMIN C) 500 MG tablet [id number] Yes Take 500 mg by mouth daily. As needed per patient. [provider]  Active   cevimeline (EVOXAC) 30 MG capsule [id number]  Take 30 mg by mouth 2 (two) times daily.   Patient not taking: Reported on 10/01/2023   [provider]  Active Self           Med Note PATTRICIA, CRYSTAL L   Mon Dec 03, 2022 11:20 AM) As needed.  omeprazole  (PRILOSEC) 40 MG capsule [id number] Yes Take 1 capsule by mouth once daily Carlan, Chelsea L, NP  Active   polyethylene glycol (MIRALAX  / GLYCOLAX ) 17 g packet [id number] Yes Take 17 g by mouth daily as needed. [provider]  Active             Recommendation:   Continue Current Plan of Care Educated daughter on viewing website [email or web address] for guidance on dementia related behavioral issues such as bathing, wandering.  Daughter will call [facility] for any benefit that will cover services for in-home assistance.   Follow Up Plan:   Telephone follow-up in 1 week  Santana Stamp BSN, CCM [place]  [facility] Population Health RN Care Manager Direct Dial: [phone]  Fax: [phone]

## 2023-10-11 ENCOUNTER — Telehealth: Payer: Self-pay

## 2023-10-14 DIAGNOSIS — R300 Dysuria: Secondary | ICD-10-CM | POA: Diagnosis not present

## 2023-10-14 DIAGNOSIS — M79672 Pain in left foot: Secondary | ICD-10-CM | POA: Diagnosis not present

## 2023-10-14 DIAGNOSIS — Z6822 Body mass index (BMI) 22.0-22.9, adult: Secondary | ICD-10-CM | POA: Diagnosis not present

## 2023-10-23 ENCOUNTER — Encounter (INDEPENDENT_AMBULATORY_CARE_PROVIDER_SITE_OTHER): Payer: Self-pay | Admitting: Gastroenterology

## 2023-11-06 ENCOUNTER — Telehealth: Payer: Self-pay

## 2023-11-06 NOTE — Patient Instructions (Signed)
 Jeanette Suarez - I am sorry I was unable to reach you today for our scheduled appointment. I work with Toribio Jerel MATSU, MD and am calling to support your healthcare needs. Please contact me at [phone] at your earliest convenience. I look forward to speaking with you soon.   Thank you,  Santana Stamp BSN, CCM [place]  [facility] Population Health RN Care Manager Direct Dial: [phone]  Fax: [phone]

## 2023-11-07 ENCOUNTER — Telehealth: Payer: Self-pay

## 2023-11-07 NOTE — Patient Outreach (Addendum)
 Three different attempts made to contact patient and family, both phones have the recording, your call cannot be completed as dialed.  Missed appointment message sent through MyChart.  Will close case.  Message sent to PCP.

## 2023-11-07 NOTE — Patient Instructions (Signed)
 Jeanette Suarez - I am sorry I was unable to reach you today for our scheduled appointment. I work with Toribio Jerel MATSU, MD and am calling to support your healthcare needs. Please contact me at [phone] at your earliest convenience. I look forward to speaking with you soon.   Thank you,  Santana Stamp BSN, CCM [place]  [facility] Population Health RN Care Manager Direct Dial: [phone]  Fax: [phone]
